# Patient Record
Sex: Male | Born: 1955 | Hispanic: Yes | Marital: Married | Smoking: Former smoker
Health system: Southern US, Community
[De-identification: ages and names within clinical notes are randomized; demographics above are authoritative.]

## PROBLEM LIST (undated history)

## (undated) DIAGNOSIS — I10 Essential (primary) hypertension: Secondary | ICD-10-CM

## (undated) DIAGNOSIS — M109 Gout, unspecified: Secondary | ICD-10-CM

## (undated) DIAGNOSIS — E119 Type 2 diabetes mellitus without complications: Secondary | ICD-10-CM

---

## 2009-01-24 ENCOUNTER — Emergency Department: Payer: Self-pay | Admitting: Emergency Medicine

## 2011-02-19 ENCOUNTER — Ambulatory Visit: Payer: Self-pay | Admitting: Internal Medicine

## 2011-03-08 ENCOUNTER — Ambulatory Visit: Payer: Self-pay | Admitting: Internal Medicine

## 2013-03-25 HISTORY — PX: JOINT REPLACEMENT: SHX530

## 2013-04-01 ENCOUNTER — Ambulatory Visit: Payer: Self-pay | Admitting: Orthopedic Surgery

## 2013-04-01 LAB — CBC
HCT: 41.1 % (ref 40.0–52.0)
MCHC: 33.8 g/dL (ref 32.0–36.0)
MCV: 85 fL (ref 80–100)
Platelet: 268 10*3/uL (ref 150–440)
RBC: 4.85 10*6/uL (ref 4.40–5.90)
RDW: 14 % (ref 11.5–14.5)

## 2013-04-01 LAB — BASIC METABOLIC PANEL
Anion Gap: 3 — ABNORMAL LOW (ref 7–16)
BUN: 21 mg/dL — ABNORMAL HIGH (ref 7–18)
Calcium, Total: 9.1 mg/dL (ref 8.5–10.1)
Co2: 27 mmol/L (ref 21–32)
EGFR (Non-African Amer.): >60
Potassium: 4.1 mmol/L (ref 3.5–5.1)
Sodium: 136 mmol/L (ref 136–145)

## 2013-04-01 LAB — URINALYSIS, COMPLETE
Bacteria: NONE SEEN
Bilirubin,UR: NEGATIVE
Blood: NEGATIVE
Glucose,UR: NEGATIVE mg/dL (ref 0–75)
Leukocyte Esterase: NEGATIVE
Nitrite: NEGATIVE
Ph: 5 (ref 4.5–8.0)
Protein: NEGATIVE
RBC,UR: 2 /HPF (ref 0–5)
Specific Gravity: 1.02 (ref 1.003–1.030)
Squamous Epithelial: 2

## 2013-04-01 LAB — PROTIME-INR
INR: 1
Prothrombin Time: 12.9 secs (ref 11.5–14.7)

## 2013-04-01 LAB — MRSA PCR SCREENING

## 2013-04-16 ENCOUNTER — Inpatient Hospital Stay: Payer: Self-pay | Admitting: Orthopedic Surgery

## 2013-04-17 LAB — BASIC METABOLIC PANEL
Anion Gap: 7 (ref 7–16)
BUN: 8 mg/dL (ref 7–18)
Calcium, Total: 8 mg/dL — ABNORMAL LOW (ref 8.5–10.1)
Chloride: 104 mmol/L (ref 98–107)
Co2: 24 mmol/L (ref 21–32)
Creatinine: 0.92 mg/dL (ref 0.60–1.30)
EGFR (African American): >60
EGFR (Non-African Amer.): >60
Osmolality: 270 (ref 275–301)
Potassium: 3.9 mmol/L (ref 3.5–5.1)

## 2013-04-20 LAB — PATHOLOGY REPORT

## 2014-10-15 NOTE — Discharge Summary (Signed)
PATIENT NAME:  Derek Townsend, Duell B MR#:  [id number] DATE OF BIRTH:  1955/07/23  DATE OF ADMISSION:  04/16/2013 DATE OF DISCHARGE:  04/19/2013  ADMITTING DIAGNOSIS: Avascular necrosis, left hip.   DISCHARGE DIAGNOSIS: Avascular necrosis, left hip.  OPERATION: On 04/16/2013, he had a left total hip arthroplasty.   SURGEON: Dr. Kennedy BuckerMichael Menz.   ANESTHESIA: Spinal.   ESTIMATED BLOOD LOSS: 500 mL.   DRAINS: Hemovac.   IMPLANTS: Medacta 4 lat Amis, 54 versa fit cup DM, with liner M28 head.   COMPLICATIONS: None.   HISTORY: Derek Townsend is a 59 year old Hispanic male, who has been having progressive pain over the past 3 years. He noticed a  leg length discrepancy where his left leg was shorter than his right. Pain with weight bearing and at rest. History of heavy alcohol use. He had an MRI, which confirmed  avascular necrosis of the hip.   PHYSICAL EXAMINATION:  LUNGS: Clear.  HEART: Regular rate and rhythm.  HEENT: Normal.  MUSCULOSKELETAL: Neurovascularly intact in left lower extremity. He keeps his legs in external rotation. Approximately half an inch shorter on the left when seated. He has -20 degrees internal rotation, external rotation 20 degrees to 40 degrees with severe pain.  SKIN: Intact.   HOSPITAL COURSE: After initial admission on 04/16/2013, he underwent a total left total hip arthroplasty. On postoperative day 1, 04/17/2013, his Hemovac output was 70 mL. Hemoglobin was 11.3. He was on room air. Physical therapy was begun on that day. On postoperative day 2, 04/18/2013, he progressed with physical therapy and ambulated 180 feet. Dressing was changed. Hemovac was discontinued. He had a bowel movement. On postoperative day 3, 04/19/2013, he ambulated 250 feet with stairs. He is stable and ready for discharge.   CONDITION AT DISCHARGE: Stable.   DISPOSITION: The patient was sent home with home health.   DISCHARGE INSTRUCTIONS: The patient will [facility] in 2 weeks for staple removal. He will have home health physical therapy and weight bear as tolerated on the left lower extremity. TED hose knee-high bilaterally. Regular diet. He may change his dressing once daily and as needed.   DISCHARGE MEDICATIONS: Please see discharge instructions for a complete list of discharge medications.  ____________________________ Leigh Blas M. Haskel KhanBerndt, NP amb:aw D: 04/20/2013 08:21:45 ET T: 04/20/2013 08:34:53 ET JOB#: [id number]  cc: Leavy Heatherly M. Haskel KhanBerndt, NP, <Dictator> Burt EkAPRIL M Xiamara Hulet FNP ELECTRONICALLY SIGNED 05/01/2013 9:50

## 2014-10-15 NOTE — Op Note (Signed)
PATIENT NAME:  Derek Townsend, Derek Townsend MR#:  [id number] DATE OF BIRTH:  Dec 21, 1955  DATE OF PROCEDURE:  04/16/2013  PREOPERATIVE DIAGNOSIS: Left hip avascular necrosis.   POSTOPERATIVE DIAGNOSIS: Left hip avascular necrosis.   PROCEDURE: Left anterior total hip.   SURGEON: Kennedy BuckerMichael Persephone Schriever, M.D.   ASSISTANT: Devota PaceApril Berndt, nurse practitioner.   ANESTHESIA: Spinal.   DESCRIPTION OF PROCEDURE: The patient was brought to the operating room, and after adequate anesthesia was obtained, the patient was placed on the operative table with the left leg in the Medacta attachment, the right leg on a well-padded table. The C-arm was brought in and good visualization of both hips could be obtained with printed pictures of both obtained. The hip was then prepped and draped using the standard fashion. C-arm was brought in to get landmarks and a direct anterior approach was made centered over the greater trochanter with incision down through the skin and subcutaneous tissue. The TFL was opened and the muscle retracted laterally. The deep fascia was then opened and the lateral femoral circumflex vessels were ligated. The anterior capsule was then opened and a femoral neck cut made. The head was removed without difficulty. It was quite deformed consistent with avascular necrosis. After removing the head, a Charnley retractor was placed. The femur was reamed up to 54 mm at which point there was good bleeding bone. After the trial was noted to fit well, the 54 mm Versafit cup DM was placed and impacted into position and felt stable. The leg was externally rotated and pubofemoral and ischiofemoral ligaments released. The leg was brought down into extension with adduction. Sequential broaching was carried out after first opening the canal with a box osteotome. A #4 stem had a tight fit and with trials it was determined that a lateral offset restored anatomy better than the standard offset. The #4 lateral offset stem was impacted down  the canal with an M 28 mm head and 54 mm Versafit cup DM liner, which were assembled on the back table. The hip was reduced and was stable to external rotation at 90 degrees and traction on the neck. The hip was thoroughly irrigated. 30 mL of 0.25% Sensorcaine with epinephrine was infiltrated into the soft tissues. Heavy quill was used to close the fascia, 2-0 quill subcutaneously with a subcutaneous Hemovac drain placed. Skin staples applied, Xeroform, 4 x 4's, ABD and tape. The patient was then sent to the recovery room in stable condition.   ESTIMATED BLOOD LOSS: 500.   COMPLICATIONS: None.   SPECIMEN: Removed femoral head.   IMPLANTS: Medacta Versafit cup DM 54 mm with associated liner, a 28 mm M head and a 4 lateralized AMIS stem. ____________________________ Leitha SchullerMichael J. Gemini Beaumier, MD mjm:sb D: 04/16/2013 09:25:28 ET T: 04/16/2013 09:32:20 ET JOB#: [id number]  cc: Leitha SchullerMichael J. Sayre Witherington, MD, <Dictator> Leitha SchullerMICHAEL J Anselmo Reihl MD ELECTRONICALLY SIGNED 04/16/2013 13:18

## 2015-10-20 ENCOUNTER — Encounter
Admission: RE | Admit: 2015-10-20 | Discharge: 2015-10-20 | Disposition: A | Discharge status: Home / Self Care | Payer: 59 | Source: Ambulatory Visit | Attending: Orthopedic Surgery | Admitting: Orthopedic Surgery

## 2015-10-20 DIAGNOSIS — Z01810 Encounter for preprocedural cardiovascular examination: Secondary | ICD-10-CM | POA: Diagnosis present

## 2015-10-20 DIAGNOSIS — Z01812 Encounter for preprocedural laboratory examination: Secondary | ICD-10-CM | POA: Insufficient documentation

## 2015-10-20 HISTORY — DX: Essential (primary) hypertension: I10

## 2015-10-20 HISTORY — DX: Type 2 diabetes mellitus without complications: E11.9

## 2015-10-20 HISTORY — DX: Gout, unspecified: M10.9

## 2015-10-20 LAB — CBC
HEMATOCRIT: 41.7 % (ref 40.0–52.0)
HEMOGLOBIN: 13.8 g/dL (ref 13.0–18.0)
MCH: 28.2 pg (ref 26.0–34.0)
MCHC: 33.1 g/dL (ref 32.0–36.0)
MCV: 85.2 fL (ref 80.0–100.0)
Platelets: 238 10*3/uL (ref 150–440)
RBC: 4.9 MIL/uL (ref 4.40–5.90)
RDW: 14.8 % — AB (ref 11.5–14.5)
WBC: 7.3 10*3/uL (ref 3.8–10.6)

## 2015-10-20 LAB — URINALYSIS COMPLETE WITH MICROSCOPIC (ARMC ONLY)
BACTERIA UA: NONE SEEN
Bilirubin Urine: NEGATIVE
Glucose, UA: NEGATIVE mg/dL
Hgb urine dipstick: NEGATIVE
KETONES UR: NEGATIVE mg/dL
Leukocytes, UA: NEGATIVE
Nitrite: NEGATIVE
PH: 6 (ref 5.0–8.0)
Protein, ur: NEGATIVE mg/dL
Specific Gravity, Urine: 1.012 (ref 1.005–1.030)

## 2015-10-20 LAB — TYPE AND SCREEN
ABO/RH(D): O POS
ANTIBODY SCREEN: NEGATIVE

## 2015-10-20 LAB — BASIC METABOLIC PANEL
Anion gap: 7 (ref 5–15)
BUN: 15 mg/dL (ref 6–20)
CHLORIDE: 106 mmol/L (ref 101–111)
CO2: 26 mmol/L (ref 22–32)
CREATININE: 0.8 mg/dL (ref 0.61–1.24)
Calcium: 9.4 mg/dL (ref 8.9–10.3)
GFR calc Af Amer: >60 mL/min (ref >60)
GFR calc non Af Amer: >60 mL/min (ref >60)
Glucose, Bld: 106 mg/dL — ABNORMAL HIGH (ref 65–99)
Potassium: 4.4 mmol/L (ref 3.5–5.1)
SODIUM: 139 mmol/L (ref 135–145)

## 2015-10-20 LAB — SURGICAL PCR SCREEN
MRSA, PCR: NEGATIVE
Staphylococcus aureus: NEGATIVE

## 2015-10-20 LAB — SEDIMENTATION RATE: Sed Rate: 11 mm/hr (ref 0–20)

## 2015-10-20 LAB — PROTIME-INR
INR: 0.99
Prothrombin Time: 13.3 seconds (ref 11.4–15.0)

## 2015-10-20 LAB — APTT: aPTT: 30 seconds (ref 24–36)

## 2015-10-20 LAB — ABO/RH: ABO/RH(D): O POS

## 2015-10-20 NOTE — Patient Instructions (Signed)
  Your procedure is scheduled on: Nov 03, 2015 (Thursday) Report to Day Surgery.([facility]) SECOND FLOOR To find out your arrival time please call [phone] between 1PM - 3PM on Nov 02, 2015 (Wednesday).  Remember: Instructions that are not followed completely may result in serious medical risk, up to and including death, or upon the discretion of your surgeon and anesthesiologist your surgery may need to be rescheduled.    __x__ 1. Do not eat food or drink liquids after midnight. No gum chewing or hard candies.     ____ 2. No Alcohol for 24 hours before or after surgery.   ____ 3. Bring all medications with you on the day of surgery if instructed.    __x__ 4. Notify your doctor if there is any change in your medical condition     (cold, fever, infections).     Do not wear jewelry, make-up, hairpins, clips or nail polish.  Do not wear lotions, powders, or perfumes. You may wear deodorant.  Do not shave 48 hours prior to surgery. Men may shave face and neck.  Do not bring valuables to the hospital.    [facility] is not responsible for any belongings or valuables.               Contacts, dentures or bridgework may not be worn into surgery.  Leave your suitcase in the car. After surgery it may be brought to your room.  For patients admitted to the hospital, discharge time is determined by your                treatment team.   Patients discharged the day of surgery will not be allowed to drive home.   Please read over the following fact sheets that you were given:   MRSA Information and Surgical Site Infection Prevention   _x___ Take these medicines the morning of surgery with A SIP OF WATER:    1. Amlodipine  2.   3.   4.  5.  6.  ____ Fleet Enema (as directed)   __x__ Use CHG Soap as directed  ____ Use inhalers on the day of surgery  ____ Stop metformin 2 days prior to surgery    ____ Take 1/2 of usual insulin dose the night before surgery and none on the morning  of surgery.   __x__ Stop Coumadin/Plavix/aspirin on (N/A)  __x__ Stop Anti-inflammatories on (NO NSAIDS)  STOP MELOXICAM ONE WEEK PRIOR TO SURGERY)  TYLENOL OK TO TAKE FOR PAIN IF NEEDED   ____ Stop supplements until after surgery.    ____ Bring C-Pap to the hospital.

## 2015-10-22 LAB — URINE CULTURE: CULTURE: NO GROWTH

## 2015-11-03 ENCOUNTER — Inpatient Hospital Stay: Payer: 59 | Admitting: Anesthesiology

## 2015-11-03 ENCOUNTER — Inpatient Hospital Stay: Payer: 59

## 2015-11-03 ENCOUNTER — Encounter
Admission: RE | Disposition: A | Discharge status: Home / Self Care | Payer: Self-pay | Source: Ambulatory Visit | Attending: Orthopedic Surgery

## 2015-11-03 ENCOUNTER — Inpatient Hospital Stay
Admission: RE | Admit: 2015-11-03 | Discharge: 2015-11-06 | DRG: 470 | Disposition: A | Discharge status: Home Health | Payer: 59 | Source: Ambulatory Visit | Attending: Orthopedic Surgery | Admitting: Orthopedic Surgery

## 2015-11-03 DIAGNOSIS — Z87891 Personal history of nicotine dependence: Secondary | ICD-10-CM | POA: Diagnosis not present

## 2015-11-03 DIAGNOSIS — M1611 Unilateral primary osteoarthritis, right hip: Secondary | ICD-10-CM | POA: Diagnosis present

## 2015-11-03 DIAGNOSIS — E119 Type 2 diabetes mellitus without complications: Secondary | ICD-10-CM | POA: Diagnosis present

## 2015-11-03 DIAGNOSIS — Z96642 Presence of left artificial hip joint: Secondary | ICD-10-CM | POA: Diagnosis present

## 2015-11-03 DIAGNOSIS — Z419 Encounter for procedure for purposes other than remedying health state, unspecified: Secondary | ICD-10-CM

## 2015-11-03 DIAGNOSIS — I10 Essential (primary) hypertension: Secondary | ICD-10-CM | POA: Diagnosis present

## 2015-11-03 DIAGNOSIS — M109 Gout, unspecified: Secondary | ICD-10-CM | POA: Diagnosis present

## 2015-11-03 DIAGNOSIS — M879 Osteonecrosis, unspecified: Secondary | ICD-10-CM | POA: Diagnosis present

## 2015-11-03 DIAGNOSIS — Z79899 Other long term (current) drug therapy: Secondary | ICD-10-CM

## 2015-11-03 DIAGNOSIS — Z791 Long term (current) use of non-steroidal anti-inflammatories (NSAID): Secondary | ICD-10-CM | POA: Diagnosis not present

## 2015-11-03 DIAGNOSIS — G8918 Other acute postprocedural pain: Secondary | ICD-10-CM

## 2015-11-03 HISTORY — PX: TOTAL HIP ARTHROPLASTY: SHX124

## 2015-11-03 LAB — CBC
HEMATOCRIT: 37.8 % — AB (ref 40.0–52.0)
Hemoglobin: 12.1 g/dL — ABNORMAL LOW (ref 13.0–18.0)
MCH: 27.7 pg (ref 26.0–34.0)
MCHC: 32 g/dL (ref 32.0–36.0)
MCV: 86.6 fL (ref 80.0–100.0)
Platelets: 220 10*3/uL (ref 150–440)
RBC: 4.37 MIL/uL — ABNORMAL LOW (ref 4.40–5.90)
RDW: 14.8 % — AB (ref 11.5–14.5)
WBC: 17.1 10*3/uL — AB (ref 3.8–10.6)

## 2015-11-03 LAB — CREATININE, SERUM
Creatinine, Ser: 0.75 mg/dL (ref 0.61–1.24)
GFR calc Af Amer: >60 mL/min (ref >60)

## 2015-11-03 LAB — GLUCOSE, CAPILLARY
GLUCOSE-CAPILLARY: 113 mg/dL — AB (ref 65–99)
GLUCOSE-CAPILLARY: 131 mg/dL — AB (ref 65–99)
Glucose-Capillary: 122 mg/dL — ABNORMAL HIGH (ref 65–99)
Glucose-Capillary: 126 mg/dL — ABNORMAL HIGH (ref 65–99)

## 2015-11-03 SURGERY — ARTHROPLASTY, HIP, TOTAL, ANTERIOR APPROACH
Anesthesia: Spinal | Site: Hip | Laterality: Right | Wound class: Clean

## 2015-11-03 MED ORDER — ENOXAPARIN SODIUM 40 MG/0.4ML ~~LOC~~ SOLN
40.0000 mg | SUBCUTANEOUS | Status: DC
  Administered 2015-11-04 – 2015-11-06 (×3): 40 mg via SUBCUTANEOUS
  Filled 2015-11-03 (×3): qty 0.4

## 2015-11-03 MED ORDER — SODIUM CHLORIDE 0.9 % IV SOLN
INTRAVENOUS | Status: DC
  Administered 2015-11-03 – 2015-11-04 (×3): via INTRAVENOUS

## 2015-11-03 MED ORDER — BUPIVACAINE-EPINEPHRINE 0.25% -1:200000 IJ SOLN
INTRAMUSCULAR | Status: DC | PRN
  Administered 2015-11-03: 30 mL

## 2015-11-03 MED ORDER — ACETAMINOPHEN 325 MG PO TABS
650.0000 mg | ORAL_TABLET | Freq: Four times a day (QID) | ORAL | Status: DC | PRN
  Administered 2015-11-06: 650 mg via ORAL
  Filled 2015-11-03: qty 2

## 2015-11-03 MED ORDER — ONDANSETRON HCL 4 MG/2ML IJ SOLN
4.0000 mg | Freq: Four times a day (QID) | INTRAMUSCULAR | Status: DC | PRN
  Administered 2015-11-03 – 2015-11-04 (×2): 4 mg via INTRAVENOUS
  Filled 2015-11-03 (×2): qty 2

## 2015-11-03 MED ORDER — CEFAZOLIN SODIUM-DEXTROSE 2-4 GM/100ML-% IV SOLN
INTRAVENOUS | Status: AC
  Filled 2015-11-03: qty 100

## 2015-11-03 MED ORDER — TRANEXAMIC ACID 1000 MG/10ML IV SOLN
1000.0000 mg | INTRAVENOUS | Status: DC | PRN
  Administered 2015-11-03: 1000 mg via INTRAVENOUS

## 2015-11-03 MED ORDER — ZOLPIDEM TARTRATE 5 MG PO TABS: 5.0000 mg | ORAL_TABLET | Freq: Every evening | ORAL | Status: DC | PRN

## 2015-11-03 MED ORDER — OXYCODONE HCL 5 MG PO TABS
5.0000 mg | ORAL_TABLET | ORAL | Status: DC | PRN
  Administered 2015-11-03: 10 mg via ORAL
  Administered 2015-11-03: 5 mg via ORAL
  Administered 2015-11-03: 10 mg via ORAL
  Administered 2015-11-03: 5 mg via ORAL
  Administered 2015-11-04: 10 mg via ORAL
  Administered 2015-11-04: 5 mg via ORAL
  Administered 2015-11-04 – 2015-11-06 (×11): 10 mg via ORAL
  Filled 2015-11-03 (×4): qty 2
  Filled 2015-11-03: qty 1
  Filled 2015-11-03 (×5): qty 2
  Filled 2015-11-03: qty 1
  Filled 2015-11-03 (×6): qty 2

## 2015-11-03 MED ORDER — PRAVASTATIN SODIUM 20 MG PO TABS
40.0000 mg | ORAL_TABLET | Freq: Every day | ORAL | Status: DC
  Administered 2015-11-03 – 2015-11-05 (×3): 40 mg via ORAL
  Filled 2015-11-03 (×3): qty 2

## 2015-11-03 MED ORDER — ONDANSETRON HCL 4 MG PO TABS: 4.0000 mg | ORAL_TABLET | Freq: Four times a day (QID) | ORAL | Status: DC | PRN

## 2015-11-03 MED ORDER — TRANEXAMIC ACID 1000 MG/10ML IV SOLN
INTRAVENOUS | Status: AC
  Filled 2015-11-03: qty 10

## 2015-11-03 MED ORDER — BISACODYL 5 MG PO TBEC
5.0000 mg | DELAYED_RELEASE_TABLET | Freq: Every day | ORAL | Status: DC | PRN
  Administered 2015-11-05: 5 mg via ORAL
  Filled 2015-11-03 (×2): qty 1

## 2015-11-03 MED ORDER — DIPHENHYDRAMINE HCL 12.5 MG/5ML PO ELIX
12.5000 mg | ORAL_SOLUTION | ORAL | Status: DC | PRN
  Filled 2015-11-03: qty 10

## 2015-11-03 MED ORDER — PHENOL 1.4 % MT LIQD
1.0000 | OROMUCOSAL | Status: DC | PRN
  Filled 2015-11-03: qty 177

## 2015-11-03 MED ORDER — ONDANSETRON HCL 4 MG/2ML IJ SOLN: 4.0000 mg | Freq: Once | INTRAMUSCULAR | Status: DC | PRN

## 2015-11-03 MED ORDER — NEOMYCIN-POLYMYXIN B GU 40-200000 IR SOLN
Status: AC
  Filled 2015-11-03: qty 2

## 2015-11-03 MED ORDER — METHOCARBAMOL 500 MG PO TABS: 500.0000 mg | ORAL_TABLET | Freq: Four times a day (QID) | ORAL | Status: DC | PRN

## 2015-11-03 MED ORDER — NEOMYCIN-POLYMYXIN B GU 40-200000 IR SOLN
Status: DC | PRN
  Administered 2015-11-03: 2 mL

## 2015-11-03 MED ORDER — BUPIVACAINE-EPINEPHRINE (PF) 0.25% -1:200000 IJ SOLN
INTRAMUSCULAR | Status: AC
  Filled 2015-11-03: qty 30

## 2015-11-03 MED ORDER — MORPHINE SULFATE (PF) 2 MG/ML IV SOLN
2.0000 mg | INTRAVENOUS | Status: DC | PRN
  Administered 2015-11-03 (×4): 2 mg via INTRAVENOUS
  Filled 2015-11-03 (×4): qty 1

## 2015-11-03 MED ORDER — ACETAMINOPHEN 650 MG RE SUPP: 650.0000 mg | Freq: Four times a day (QID) | RECTAL | Status: DC | PRN

## 2015-11-03 MED ORDER — BUPIVACAINE HCL (PF) 0.5 % IJ SOLN
INTRAMUSCULAR | Status: DC | PRN
  Administered 2015-11-03: 3 mL

## 2015-11-03 MED ORDER — ALLOPURINOL 100 MG PO TABS
200.0000 mg | ORAL_TABLET | Freq: Every day | ORAL | Status: DC
  Administered 2015-11-03 – 2015-11-06 (×4): 200 mg via ORAL
  Filled 2015-11-03 (×4): qty 2

## 2015-11-03 MED ORDER — FENTANYL CITRATE (PF) 100 MCG/2ML IJ SOLN: 25.0000 ug | INTRAMUSCULAR | Status: DC | PRN

## 2015-11-03 MED ORDER — AMLODIPINE BESYLATE 10 MG PO TABS
10.0000 mg | ORAL_TABLET | Freq: Every day | ORAL | Status: DC
  Administered 2015-11-04 – 2015-11-06 (×3): 10 mg via ORAL
  Filled 2015-11-03 (×3): qty 1

## 2015-11-03 MED ORDER — PHENYLEPHRINE HCL 10 MG/ML IJ SOLN
INTRAMUSCULAR | Status: DC | PRN
  Administered 2015-11-03: 100 ug via INTRAVENOUS

## 2015-11-03 MED ORDER — METOCLOPRAMIDE HCL 10 MG PO TABS: 5.0000 mg | ORAL_TABLET | Freq: Three times a day (TID) | ORAL | Status: DC | PRN

## 2015-11-03 MED ORDER — PROPOFOL 500 MG/50ML IV EMUL
INTRAVENOUS | Status: DC | PRN
  Administered 2015-11-03: 75 ug/kg/min via INTRAVENOUS

## 2015-11-03 MED ORDER — HYDROMORPHONE HCL 1 MG/ML IJ SOLN
0.5000 mg | Freq: Once | INTRAMUSCULAR | Status: AC
  Administered 2015-11-03: 0.5 mg via INTRAVENOUS
  Filled 2015-11-03: qty 1

## 2015-11-03 MED ORDER — MAGNESIUM CITRATE PO SOLN
1.0000 | Freq: Once | ORAL | Status: AC | PRN
  Administered 2015-11-05: 1 via ORAL
  Filled 2015-11-03 (×2): qty 296

## 2015-11-03 MED ORDER — DEXTROSE 5 % IV SOLN
500.0000 mg | Freq: Four times a day (QID) | INTRAVENOUS | Status: DC | PRN
  Filled 2015-11-03: qty 5

## 2015-11-03 MED ORDER — CEFAZOLIN SODIUM-DEXTROSE 2-4 GM/100ML-% IV SOLN
2.0000 g | Freq: Four times a day (QID) | INTRAVENOUS | Status: AC
  Administered 2015-11-03 (×3): 2 g via INTRAVENOUS
  Filled 2015-11-03 (×3): qty 100

## 2015-11-03 MED ORDER — FAMOTIDINE 20 MG PO TABS
ORAL_TABLET | ORAL | Status: AC
  Administered 2015-11-03: 20 mg via ORAL
  Filled 2015-11-03: qty 1

## 2015-11-03 MED ORDER — METOCLOPRAMIDE HCL 5 MG/ML IJ SOLN: 5.0000 mg | Freq: Three times a day (TID) | INTRAMUSCULAR | Status: DC | PRN

## 2015-11-03 MED ORDER — MENTHOL 3 MG MT LOZG
1.0000 | LOZENGE | OROMUCOSAL | Status: DC | PRN
  Filled 2015-11-03: qty 9

## 2015-11-03 MED ORDER — FAMOTIDINE 20 MG PO TABS
20.0000 mg | ORAL_TABLET | Freq: Once | ORAL | Status: AC
  Administered 2015-11-03: 20 mg via ORAL

## 2015-11-03 MED ORDER — MIDAZOLAM HCL 5 MG/5ML IJ SOLN
INTRAMUSCULAR | Status: DC | PRN
  Administered 2015-11-03: 2 mg via INTRAVENOUS

## 2015-11-03 MED ORDER — SODIUM CHLORIDE 0.9 % IV SOLN
INTRAVENOUS | Status: DC
  Administered 2015-11-03 (×4): via INTRAVENOUS

## 2015-11-03 MED ORDER — PROPOFOL 10 MG/ML IV BOLUS
INTRAVENOUS | Status: DC | PRN
  Administered 2015-11-03: 30 mg via INTRAVENOUS

## 2015-11-03 MED ORDER — CEFAZOLIN SODIUM-DEXTROSE 2-4 GM/100ML-% IV SOLN
2.0000 g | Freq: Once | INTRAVENOUS | Status: AC
  Administered 2015-11-03: 2 g via INTRAVENOUS

## 2015-11-03 MED ORDER — DOCUSATE SODIUM 100 MG PO CAPS
100.0000 mg | ORAL_CAPSULE | Freq: Two times a day (BID) | ORAL | Status: DC
  Administered 2015-11-03 – 2015-11-06 (×7): 100 mg via ORAL
  Filled 2015-11-03 (×6): qty 1

## 2015-11-03 MED ORDER — MAGNESIUM HYDROXIDE 400 MG/5ML PO SUSP
30.0000 mL | Freq: Every day | ORAL | Status: DC | PRN
  Administered 2015-11-04 – 2015-11-05 (×3): 30 mL via ORAL
  Filled 2015-11-03 (×3): qty 30

## 2015-11-03 MED ORDER — ONDANSETRON HCL 4 MG/2ML IJ SOLN
INTRAMUSCULAR | Status: DC | PRN
  Administered 2015-11-03: 4 mg via INTRAVENOUS

## 2015-11-03 MED ORDER — KETAMINE HCL 50 MG/ML IJ SOLN
INTRAMUSCULAR | Status: DC | PRN
  Administered 2015-11-03: 50 mg via INTRAMUSCULAR

## 2015-11-03 SURGICAL SUPPLY — 44 items
BLADE SAW SAG 18.5X105 (BLADE) ×3 IMPLANT
BNDG COHESIVE 6X5 TAN STRL LF (GAUZE/BANDAGES/DRESSINGS) ×6 IMPLANT
CANISTER SUCT 1200ML W/VALVE (MISCELLANEOUS) ×3 IMPLANT
CAPT HIP TOTAL 3 ×3 IMPLANT
CATH FOL LEG HOLDER (MISCELLANEOUS) ×3 IMPLANT
CATH TRAY METER 16FR LF (MISCELLANEOUS) ×3 IMPLANT
CHLORAPREP W/TINT 26ML (MISCELLANEOUS) ×3 IMPLANT
DRAPE C-ARM XRAY 36X54 (DRAPES) ×3 IMPLANT
DRAPE INCISE IOBAN 66X60 STRL (DRAPES) IMPLANT
DRAPE POUCH INSTRU U-SHP 10X18 (DRAPES) ×3 IMPLANT
DRAPE SHEET LG 3/4 BI-LAMINATE (DRAPES) ×9 IMPLANT
DRAPE STERI IOBAN 125X83 (DRAPES) ×3 IMPLANT
DRAPE TABLE BACK 80X90 (DRAPES) ×3 IMPLANT
DRSG OPSITE POSTOP 4X8 (GAUZE/BANDAGES/DRESSINGS) ×6 IMPLANT
ELECT BLADE 6.5 EXT (BLADE) ×3 IMPLANT
GAUZE SPONGE 4X4 12PLY STRL (GAUZE/BANDAGES/DRESSINGS) ×3 IMPLANT
GLOVE BIOGEL PI IND STRL 9 (GLOVE) ×1 IMPLANT
GLOVE BIOGEL PI INDICATOR 9 (GLOVE) ×2
GLOVE SURG ORTHO 9.0 STRL STRW (GLOVE) ×6 IMPLANT
GOWN STRL REUS W/ TWL LRG LVL3 (GOWN DISPOSABLE) ×1 IMPLANT
GOWN STRL REUS W/TWL LRG LVL3 (GOWN DISPOSABLE) ×2
GOWN SURG XXL (GOWNS) ×3 IMPLANT
HEMOVAC 400CC 10FR (MISCELLANEOUS) ×3 IMPLANT
HOOD PEEL AWAY FLYTE STAYCOOL (MISCELLANEOUS) ×3 IMPLANT
MAT BLUE FLOOR 46X72 FLO (MISCELLANEOUS) ×3 IMPLANT
NDL SAFETY 18GX1.5 (NEEDLE) ×3 IMPLANT
NEEDLE SPNL 18GX3.5 QUINCKE PK (NEEDLE) ×3 IMPLANT
NS IRRIG 1000ML POUR BTL (IV SOLUTION) ×3 IMPLANT
PACK HIP COMPR (MISCELLANEOUS) ×3 IMPLANT
SOL PREP PVP 2OZ (MISCELLANEOUS) ×3
SOLUTION PREP PVP 2OZ (MISCELLANEOUS) ×1 IMPLANT
STAPLER SKIN PROX 35W (STAPLE) ×3 IMPLANT
STRAP SAFETY BODY (MISCELLANEOUS) ×3 IMPLANT
SUT DVC 2 QUILL PDO  T11 36X36 (SUTURE) ×2
SUT DVC 2 QUILL PDO T11 36X36 (SUTURE) ×1 IMPLANT
SUT DVC QUILL MONODERM 30X30 (SUTURE) ×3 IMPLANT
SUT SILK 0 (SUTURE) ×2
SUT SILK 0 30XBRD TIE 6 (SUTURE) ×1 IMPLANT
SUT VIC AB 1 CT1 36 (SUTURE) ×3 IMPLANT
SYR 20CC LL (SYRINGE) ×3 IMPLANT
SYR 30ML LL (SYRINGE) ×3 IMPLANT
TAPE MICROFOAM 4IN (TAPE) ×3 IMPLANT
TOWEL OR 17X26 4PK STRL BLUE (TOWEL DISPOSABLE) ×3 IMPLANT
TUBE KAMVAC SUCTION (TUBING) ×3 IMPLANT

## 2015-11-03 NOTE — Op Note (Signed)
11/03/2015  9:04 AM  PATIENT:  Derek Townsend  60 y.o. male  PRE-OPERATIVE DIAGNOSIS:  avascular necrosis right hip  POST-OPERATIVE DIAGNOSIS:  avascular necrosis  PROCEDURE:  Procedure(s): TOTAL HIP ARTHROPLASTY ANTERIOR APPROACH (Right)  SURGEON: Leitha SchullerMichael J Song Myre, MD  ASSISTANTS: None  ANESTHESIA:   spinal  EBL:  Total I/O In: 1400 [I.V.:1400] Out: 800 [Urine:400; Blood:400]  BLOOD ADMINISTERED:none  DRAINS: none   LOCAL MEDICATIONS USED:  MARCAINE     SPECIMEN:  Source of Specimen:  Right femoral head  DISPOSITION OF SPECIMEN:  PATHOLOGY  COUNTS:  YES  TOURNIQUET:  * No tourniquets in log *  IMPLANTS: Medacta AMIS 4 standard stem with 56 mm Mpact cup DM and liner with S 28 mm head  DICTATION: .Dragon Dictation   The patient was brought to the operating room and after spinal anesthesia was obtained patient was placed on the operative table with the ipsilateral foot into the Medacta attachment, contralateral leg on a well-padded table. C-arm was brought in and preop template x-ray taken. After prepping and draping in usual sterile fashion appropriate patient identification and timeout procedures were completed. Anterior approach to the hip was obtained and centered over the greater trochanter and TFL muscle. The subcutaneous tissue was incised hemostasis being achieved by electrocautery. TFL fascia was incised and the muscle retracted laterally deep retractor placed. The lateral femoral circumflex vessels were identified and ligated. The anterior capsule was exposed and a capsulotomy performed. The neck was identified and a femoral neck cut carried out with a saw. The head was removed without difficulty and showed sclerotic femoral head and acetabulum. Reaming was carried out to 54 mm and a 56 mm cup trial gave appropriate tightness to the acetabular component a 56 Mpact cup DM cup was impacted into position. The leg was then externally rotated and ischiofemoral and  pubolofemoral releases carried out. The femur was sequentially broached to a size 4, size 4 stem and S head with standard neck trials were placed and the final components chosen. The 4 standard stem was inserted along with a S 28 mm head and 86 mm liner. The hip was reduced and was stable the wound was thoroughly irrigated with a dilute Betadine solution. The deep fascia closed using a heavy Quill after infiltration of 30 cc of quarter percent Sensorcaine with epinephrine. 2-0Quill to close the skin with skin staples Xeroform and honeycomb dressing applied  PLAN OF CARE: Admit to inpatient   ```

## 2015-11-03 NOTE — OR Nursing (Signed)
Sacral pad sent to OR 

## 2015-11-03 NOTE — H&P (Signed)
Reviewed paper H+P, will be scanned into chart. No changes noted.  

## 2015-11-03 NOTE — Anesthesia Procedure Notes (Signed)
Spinal Patient location during procedure: OR Start time: 11/03/2015 7:15 AM End time: 11/03/2015 7:30 AM Staffing Resident/CRNA: Shallyn Constancio Performed by: resident/CRNA  Preanesthetic Checklist Completed: patient identified, site marked, surgical consent, pre-op evaluation, timeout performed, IV checked, risks and benefits discussed and monitors and equipment checked Spinal Block Patient position: sitting Prep: Betadine Patient monitoring: heart rate, continuous pulse ox, blood pressure and cardiac monitor Approach: midline Location: L3-4 Injection technique: single-shot Needle Needle type: Whitacre and Introducer  Needle gauge: 24 G Needle length: 9 cm Additional Notes Negative paresthesia. Negative blood return. Positive free-flowing CSF. Expiration date of kit checked and confirmed. Patient tolerated procedure well, without complications.

## 2015-11-03 NOTE — Anesthesia Preprocedure Evaluation (Signed)
Anesthesia Evaluation  Patient identified by MRN, date of birth, ID band Patient awake    Reviewed: Allergy & Precautions, NPO status , Patient's Chart, lab work & pertinent test results  Airway Mallampati: II       Dental  (+) Missing   Pulmonary neg pulmonary ROS, former smoker,    breath sounds clear to auscultation       Cardiovascular Exercise Tolerance: Good hypertension, Pt. on medications  Rhythm:Regular     Neuro/Psych    GI/Hepatic negative GI ROS, Neg liver ROS,   Endo/Other  diabetes, Type 2  Renal/GU      Musculoskeletal   Abdominal Normal abdominal exam  (+)   Peds  Hematology   Anesthesia Other Findings   Reproductive/Obstetrics                             Anesthesia Physical Anesthesia Plan  ASA: II  Anesthesia Plan: Spinal   Post-op Pain Management:    Induction:   Airway Management Planned: Natural Airway and Nasal Cannula  Additional Equipment:   Intra-op Plan:   Post-operative Plan:   Informed Consent: I have reviewed the patients History and Physical, chart, labs and discussed the procedure including the risks, benefits and alternatives for the proposed anesthesia with the patient or authorized representative who has indicated his/her understanding and acceptance.     Plan Discussed with: CRNA  Anesthesia Plan Comments:         Anesthesia Quick Evaluation

## 2015-11-03 NOTE — NC FL2 (Signed)
[place] MEDICAID FL2 LEVEL OF CARE SCREENING TOOL     IDENTIFICATION  Patient Name: Derek Townsend Birthdate: 04/17/1956 Sex: male Admission Date (Current Location): 11/03/2015  [place]   Facility and Address:  [facility], [address], [place], [place] [address]      Provider Number: [id number]  Attending Physician Name and Address:  Kennedy BuckerMichael Menz, MD  Relative Name and Phone Number:       Current Level of Care: Hospital Recommended Level of Care: Skilled Nursing Facility Prior Approval Number:    Date Approved/Denied:   PASRR Number:  [id number])  Discharge Plan: SNF    Current Diagnoses: Patient Active Problem List   Diagnosis Date Noted  . Osteonecrosis of right hip (HCC) 11/03/2015   Gout    High blood pressure    High cholesterol    Avascular necrosis of bone of left hip (CMS-HCC)  S/P left total hip replacement on 04/16/13  History of heavy alcohol consumption       Orientation RESPIRATION BLADDER Height & Weight     Self, Time, Situation, Place  Normal Continent Weight: 183 lb (83.008 kg) Height:     BEHAVIORAL SYMPTOMS/MOOD NEUROLOGICAL BOWEL NUTRITION STATUS   (none )  (none ) Continent Diet (Diet: Clear Liquid )  AMBULATORY STATUS COMMUNICATION OF NEEDS Skin   Extensive Assist Verbally Surgical wounds (Incision: Right Hip. )                       Personal Care Assistance Level of Assistance  Bathing, Feeding, Dressing Bathing Assistance: Limited assistance Feeding assistance: Independent Dressing Assistance: Limited assistance     Functional Limitations Info  Sight, Hearing, Speech Sight Info: Adequate Hearing Info: Adequate Speech Info: Adequate    SPECIAL CARE FACTORS FREQUENCY  PT (By licensed PT), OT (By licensed OT)     PT Frequency:  (5) OT Frequency:  (5)            Contractures      Additional Factors Info  Code Status, Allergies Code Status  Info:  (Not on File ) Allergies Info:  (No Known Allergies )           Current Medications (11/03/2015):  This is the current hospital active medication list Current Facility-Administered Medications  Medication Dose Route Frequency Provider Last Rate Last Dose  . 0.9 %  sodium chloride infusion   Intravenous Continuous Kennedy BuckerMichael Menz, MD 75 mL/hr at 11/03/15 1247    . acetaminophen (TYLENOL) tablet 650 mg  650 mg Oral Q6H PRN Kennedy BuckerMichael Menz, MD       Or  . acetaminophen (TYLENOL) suppository 650 mg  650 mg Rectal Q6H PRN Kennedy BuckerMichael Menz, MD      . allopurinol (ZYLOPRIM) tablet 200 mg  200 mg Oral Daily Kennedy BuckerMichael Menz, MD   200 mg at 11/03/15 1242  . [START ON 11/04/2015] amLODipine (NORVASC) tablet 10 mg  10 mg Oral Daily Kennedy BuckerMichael Menz, MD      . bisacodyl (DULCOLAX) EC tablet 5 mg  5 mg Oral Daily PRN Kennedy BuckerMichael Menz, MD      . ceFAZolin (ANCEF) 2-4 GM/100ML-% IVPB           . ceFAZolin (ANCEF) IVPB 2g/100 mL premix  2 g Intravenous Q6H Kennedy BuckerMichael Menz, MD   2 g at 11/03/15 1243  . diphenhydrAMINE (BENADRYL) 12.5 MG/5ML elixir 12.5-25 mg  12.5-25 mg Oral Q4H PRN Kennedy BuckerMichael Menz, MD      .  docusate sodium (COLACE) capsule 100 mg  100 mg Oral BID Kennedy Bucker, MD   100 mg at 11/03/15 1242  . [START ON 11/04/2015] enoxaparin (LOVENOX) injection 40 mg  40 mg Subcutaneous Q24H Kennedy Bucker, MD      . magnesium citrate solution 1 Bottle  1 Bottle Oral Once PRN Kennedy Bucker, MD      . magnesium hydroxide (MILK OF MAGNESIA) suspension 30 mL  30 mL Oral Daily PRN Kennedy Bucker, MD      . menthol-cetylpyridinium (CEPACOL) lozenge 3 mg  1 lozenge Oral PRN Kennedy Bucker, MD       Or  . phenol (CHLORASEPTIC) mouth spray 1 spray  1 spray Mouth/Throat PRN Kennedy Bucker, MD      . methocarbamol (ROBAXIN) tablet 500 mg  500 mg Oral Q6H PRN Kennedy Bucker, MD       Or  . methocarbamol (ROBAXIN) 500 mg in dextrose 5 % 50 mL IVPB  500 mg Intravenous Q6H PRN Kennedy Bucker, MD      . metoCLOPramide (REGLAN) tablet 5-10 mg  5-10 mg  Oral Q8H PRN Kennedy Bucker, MD       Or  . metoCLOPramide (REGLAN) injection 5-10 mg  5-10 mg Intravenous Q8H PRN Kennedy Bucker, MD      . morphine 2 MG/ML injection 2 mg  2 mg Intravenous Q1H PRN Kennedy Bucker, MD   2 mg at 11/03/15 1324  . ondansetron (ZOFRAN) tablet 4 mg  4 mg Oral Q6H PRN Kennedy Bucker, MD       Or  . ondansetron [facility]) injection 4 mg  4 mg Intravenous Q6H PRN Kennedy Bucker, MD      . oxyCODONE (Oxy IR/ROXICODONE) immediate release tablet 5-10 mg  5-10 mg Oral Q3H PRN Kennedy Bucker, MD   5 mg at 11/03/15 1242  . pravastatin (PRAVACHOL) tablet 40 mg  40 mg Oral QHS Kennedy Bucker, MD      . zolpidem [facility]) tablet 5 mg  5 mg Oral QHS PRN,MR X 1 Kennedy Bucker, MD         Discharge Medications: Please see discharge summary for a list of discharge medications.  Relevant Imaging Results:  Relevant Lab Results:   Additional Information  (SSN: [id number])  Haig Prophet, LCSW

## 2015-11-03 NOTE — Transfer of Care (Signed)
Immediate Anesthesia Transfer of Care Note  Patient: Derek Townsend  Procedure(s) Performed: Procedure(s): TOTAL HIP ARTHROPLASTY ANTERIOR APPROACH (Right)  Patient Location: PACU  Anesthesia Type:Spinal  Level of Consciousness: sedated  Airway & Oxygen Therapy: Patient Spontanous Breathing and Patient connected to face mask oxygen  Post-op Assessment: Report given to RN and Post -op Vital signs reviewed and stable  Post vital signs: Reviewed and stable  Last Vitals:  Filed Vitals:   11/03/15 0603  BP: 144/84  Pulse: 78  Temp: 37.2 C  Resp: 16    Last Pain: There were no vitals filed for this visit.       Complications: No apparent anesthesia complications

## 2015-11-03 NOTE — Progress Notes (Signed)
PT Attempt Note  Patient Details Name: Derek LamasFrancisco B Fairclough MRN: [id number] DOB: 12/03/1955   Cancelled Treatment:    Reason Eval/Treat Not Completed: Pain limiting ability to participate. Chart reviewed and RN consulted. Attempted to perform PT evaluation twice, first time at 1400 and second time at 1500. Pt refusing during both attempts due to pain. RN confirms that pt has been adequately medicated with morphine. Agrees to PT evaluation tomorrow AM. Will attempt PT evaluation tomorrow AM as appropriate.  Sharalyn InkJason D Chanetta Moosman PT, DPT   Bryar Dahms 11/03/2015, 3:38 PM

## 2015-11-03 NOTE — Anesthesia Postprocedure Evaluation (Signed)
Anesthesia Post Note  Patient: Jarome LamasFrancisco B Lasker  Procedure(s) Performed: Procedure(s) (LRB): TOTAL HIP ARTHROPLASTY ANTERIOR APPROACH (Right)  Patient location during evaluation: PACU Anesthesia Type: General Level of consciousness: awake Pain management: satisfactory to patient Vital Signs Assessment: post-procedure vital signs reviewed and stable Respiratory status: nonlabored ventilation Cardiovascular status: stable Anesthetic complications: no    Last Vitals:  Filed Vitals:   11/03/15 0603 11/03/15 0902  BP: 144/84 96/54  Pulse: 78 73  Temp: 37.2 C 35.9 C  Resp: 16 13    Last Pain: There were no vitals filed for this visit.               VAN STAVEREN,Myrtle Haller

## 2015-11-04 ENCOUNTER — Encounter: Payer: Self-pay | Admitting: Orthopedic Surgery

## 2015-11-04 LAB — GLUCOSE, CAPILLARY
GLUCOSE-CAPILLARY: 131 mg/dL — AB (ref 65–99)
Glucose-Capillary: 132 mg/dL — ABNORMAL HIGH (ref 65–99)

## 2015-11-04 LAB — BASIC METABOLIC PANEL
Anion gap: 6 (ref 5–15)
BUN: 10 mg/dL (ref 6–20)
CALCIUM: 8.2 mg/dL — AB (ref 8.9–10.3)
CO2: 25 mmol/L (ref 22–32)
CREATININE: 0.8 mg/dL (ref 0.61–1.24)
Chloride: 104 mmol/L (ref 101–111)
GFR calc Af Amer: >60 mL/min (ref >60)
GFR calc non Af Amer: >60 mL/min (ref >60)
GLUCOSE: 120 mg/dL — AB (ref 65–99)
Potassium: 3.8 mmol/L (ref 3.5–5.1)
Sodium: 135 mmol/L (ref 135–145)

## 2015-11-04 LAB — CREATININE, SERUM: CREATININE: 0.73 mg/dL (ref 0.61–1.24)

## 2015-11-04 NOTE — Progress Notes (Signed)
Clinical Social Worker (CSW) received SNF consult. PT is recommending home health. RN Case Manager is aware of above. Please reconsult if future social work needs arise. CSW signing off.   Dionna Wiedemann Morgan, LCSW [phone] 

## 2015-11-04 NOTE — Progress Notes (Signed)
   Subjective: 1 Day Post-Op Procedure(s) (LRB): TOTAL HIP ARTHROPLASTY ANTERIOR APPROACH (Right) Patient reports pain as 5 on 0-10 scale and 7 on 0-10 scale.   Patient is well, and has had no acute complaints or problems Denies any CP, SOB, ABD pain. We will continue therapy today.  Plan is to go Home after hospital stay.  Objective: Vital signs in last 24 hours: Temp:  [96.6 F (35.9 C)-98.7 F (37.1 C)] 98.2 F (36.8 C) (05/12 0402) Pulse Rate:  [59-82] 82 (05/12 0402) Resp:  [13-18] 18 (05/12 0402) BP: (96-131)/(54-78) 129/75 mmHg (05/12 0402) SpO2:  [95 %-100 %] 95 % (05/12 0402) Weight:  [83 kg (182 lb 15.7 oz)] 83 kg (182 lb 15.7 oz) (05/11 1751)  Intake/Output from previous day: 05/11 0701 - 05/12 0700 In: 4296.3 [P.O.:1080; I.V.:3216.3] Out: 4015 [Urine:3615; Blood:400] Intake/Output this shift:     Recent Labs  11/03/15 1137  HGB 12.1*    Recent Labs  11/03/15 1137  WBC 17.1*  RBC 4.37*  HCT 37.8*  PLT 220    Recent Labs  11/03/15 1137 11/04/15 0535  CREATININE 0.75 0.73   No results for input(s): LABPT, INR in the last 72 hours.  EXAM General - Patient is Alert, Appropriate and Oriented Extremity - Neurovascular intact Sensation intact distally Intact pulses distally Dorsiflexion/Plantar flexion intact No cellulitis present Dressing - dressing C/D/I and no drainage Motor Function - intact, moving foot and toes well on exam.   Past Medical History  Diagnosis Date  . Hypertension   . Diabetes mellitus without complication (HCC)     diet controlled  . Gout     Assessment/Plan:   1 Day Post-Op Procedure(s) (LRB): TOTAL HIP ARTHROPLASTY ANTERIOR APPROACH (Right) Active Problems:   Osteonecrosis of right hip (HCC)  Estimated body mass index is 30.45 kg/(m^2) as calculated from the following:   Height as of this encounter: 5\' 5"  (1.651 m).   Weight as of this encounter: 83 kg (182 lb 15.7 oz). Advance diet Up with therapy  Needs  BM Recheck labs in the am  DVT Prophylaxis - Lovenox, Foot Pumps and TED hose Weight-Bearing as tolerated to right leg D/C O2 and Pulse OX and try on Room Air  T. Cranston Neighborhris Tanaysia Bhardwaj, PA-C [facility] 11/04/2015, 7:54 AM

## 2015-11-04 NOTE — Evaluation (Signed)
Physical Therapy Evaluation Patient Details Name: Derek Townsend MRN: [id number] DOB: 1955/10/14 Today's Date: 11/04/2015   History of Present Illness  60 y/o male s/p anterior hip replacement secondary to osteonecrosis of the R hip. Pt had L hip replaced a few years ago and recovered well  Clinical Impression  Pt shows good effort with all PT acts but is very pain limited and guarded.  He was very motivated with 12 minutes of exercises but was strength and pain limited with these.  He does not have any abrupt safety issues with ambulation but is very limited and inconsistent and struggles with 10 ft of ambulation (needing a lot of encouragement do take more than just the first few steps.  Pt wants to go home with PT and should be able to if his pain calms down and he makes continued gains with ambulation.     Follow Up Recommendations Home health PT (pt will need to increase mobility before this is safe)    Equipment Recommendations       Recommendations for Other Services       Precautions / Restrictions Precautions Precautions: Anterior Hip;Fall Precaution Booklet Issued: Yes (comment) Restrictions Weight Bearing Restrictions:  (WBAT)      Mobility  Bed Mobility Overal bed mobility: Needs Assistance Bed Mobility: Supine to Sit     Supine to sit: Min guard;Min assist     General bed mobility comments: Pt shows very good effort (slow and labored) getting to EOB using other LE and UEs to slide R leg to EOB and heavy use of rails to get up but he needs very little actual physical assist.   Transfers Overall transfer level: Modified independent Equipment used: Rolling walker (2 wheeled)             General transfer comment: Pt has increased pain with the transitions to/from sit/stand but is able to do so w/o direct assist.  Pt very guarded and slow with the effort.   Ambulation/Gait Ambulation/Gait assistance: Min guard;Min assist Ambulation Distance (Feet): 10  Feet Assistive device: Rolling walker (2 wheeled)       General Gait Details: Pt pain limited and hesitant to do much walking. He is very guarded and does not display any consistency with steppage, though he does take a few bigger than shuffling steps he is unable to really show any cadence.  Stairs            Wheelchair Mobility    Modified Rankin (Stroke Patients Only)       Balance                                             Pertinent Vitals/Pain Pain Assessment: 0-10 Pain Score: 7  (increases with exercises/activity)    Home Living Family/patient expects to be discharged to:: Private residence Living Arrangements: Spouse/significant other Available Help at Discharge: Family   Home Access: Stairs to enter Entrance Stairs-Rails: Left Entrance Stairs-Number of Steps: 3   Home Equipment: Environmental consultant - 2 wheels;Cane - single point      Prior Function Level of Independence: Independent         Comments: Pt reports he was working a job that requires a lot of standing/walking.  Had no issues with ADLs, getting OOH, etc     Hand Dominance        Extremity/Trunk Assessment  Upper Extremity Assessment: Overall WFL for tasks assessed           Lower Extremity Assessment: RLE deficits/detail (pain limited, grossly 3-/5 in hip, knee and ankle 4/5)         Communication   Communication: No difficulties (English second language)  Cognition Arousal/Alertness: Awake/alert Behavior During Therapy: WFL for tasks assessed/performed Overall Cognitive Status: Within Functional Limits for tasks assessed                      General Comments      Exercises Total Joint Exercises Ankle Circles/Pumps: Strengthening;10 reps Gluteal Sets: AROM;10 reps Towel Squeeze: AROM;10 reps Short Arc Quad: Strengthening;10 reps;AROM Heel Slides: 5 reps;AAROM Hip ABduction/ADduction: AAROM;5 reps      Assessment/Plan    PT Assessment Patient  needs continued PT services  PT Diagnosis Difficulty walking;Generalized weakness;Acute pain   PT Problem List Decreased strength;Decreased activity tolerance;Decreased range of motion;Decreased mobility;Decreased coordination;Decreased balance;Decreased knowledge of use of DME;Decreased safety awareness;Decreased knowledge of precautions;Pain  PT Treatment Interventions DME instruction;Gait training;Stair training;Functional mobility training;Therapeutic activities;Therapeutic exercise;Balance training;Neuromuscular re-education;Patient/family education   PT Goals (Current goals can be found in the Care Plan section) Acute Rehab PT Goals Patient Stated Goal: go home PT Goal Formulation: With patient Time For Goal Achievement: 11/18/15 Potential to Achieve Goals: Good    Frequency BID   Barriers to discharge        Co-evaluation               End of Session Equipment Utilized During Treatment: Gait belt Activity Tolerance: Patient limited by pain Patient left: with bed alarm set;with call bell/phone within reach           Time: 0841-0915 PT Time Calculation (min) (ACUTE ONLY): 34 min   Charges:   PT Evaluation $PT Eval Low Complexity: 1 Procedure PT Treatments $Therapeutic Exercise: 8-22 mins   PT G Codes:       Loran SentersGalen Chrystie Hagwood, PT, DPT [phone]  Malachi ProGalen R Kimyatta Lecy 11/04/2015, 9:38 AM

## 2015-11-04 NOTE — Care Management (Signed)
Met again with patient to give Lovenox price ($50) and he agrees but to discuss home health PT again. He states that his wife will be here soon and he will have her call me. He states that "his wife knows someone that can do therapy". I explained that I would like to know "who" it is so that we could make sure he was having PT when he leaves and in case they want to file PT on his health insurance.

## 2015-11-04 NOTE — Care Management Note (Addendum)
Case Management Note  Patient Details  Name: Derek Townsend MRN: [id number] Date of Birth: February 17, 1956  Subjective/Objective:                  Met with patient to discuss discharge planning. He lives with his wife. He is not convinced that he will need HHPT although physical therapy is recommending it. He uses [facility] for Rx [phone]. He states he has a rolling walker and does not feel that he will need a bedside commode.   Action/Plan: List of home health agencies left with patient for review. Lovenox 12m #14 called in to [facility] Will continue to follow.   Expected Discharge Date:                  Expected Discharge Plan:     In-House Referral:     Discharge planning Services  CM Consult  Post Acute Care Choice:  Home Health Choice offered to:  Patient  DME Arranged:    DME Agency:     HH Arranged:   ([place]ission Viejolist provided) [place]     Status of Service:  In process, will continue to follow  Medicare Important Message Given:    Date Medicare IM Given:    Medicare IM give by:    Date Additional Medicare IM Given:    Additional Medicare Important Message give by:     If discussed at LPierceof Stay Meetings, dates discussed:    Additional Comments: Lovenox $50.  AMarshell Garfinkel RN 11/04/2015, 11:05 AM

## 2015-11-04 NOTE — Progress Notes (Signed)
Physical Therapy Treatment Patient Details Name: Derek Townsend MRN: [id number] DOB: 1956-04-24 Today's Date: 11/04/2015    History of Present Illness 60 y/o male s/p anterior hip replacement secondary to osteonecrosis of the R hip. Pt had L hip replaced a few years ago and recovered well    PT Comments    Pt shows increased standing and activity tolerance this afternoon, but is still pain limited and generally hesitant with exercises and standing acts.  He shows good effort but clearly is sore and painful at the incision site and is unable to do much with hip flexion against gravity.    Follow Up Recommendations  Home health PT     Equipment Recommendations       Recommendations for Other Services       Precautions / Restrictions Precautions Precautions: Anterior Hip Restrictions RLE Weight Bearing: Weight bearing as tolerated    Mobility  Bed Mobility Overal bed mobility: Needs Assistance Bed Mobility: Sit to Supine       Sit to supine: Min assist;Mod assist (lifting R LE into bed, pt made attempt with L LE assist)   General bed mobility comments: Pt able to initially lift R LE but is unable to elevate it enough to get back into bed and needs assist to lift and position once in supine  Transfers Overall transfer level: Modified independent Equipment used: Rolling walker (2 wheeled)             General transfer comment: Pt initially he seemed hesitant to sit, but with cuing and assist with positioning/sequencing he was able to control his descent back to bed  Ambulation/Gait Ambulation/Gait assistance: Min guard;Min assist Ambulation Distance (Feet): 30 Feet Assistive device: Rolling walker (2 wheeled)       General Gait Details: Pt again pain limited and generally hesitant with ambulation/WBing but is able to increased speed and confidence with ambulation this afternoon.  He initially had difficulty even clearing R toes during swing through, but is able to  improve this signficantly as he warmed up.  Pt still very slow and hesitant, but able to string a few more consistent steps together near the end of the effort.    Stairs            Wheelchair Mobility    Modified Rankin (Stroke Patients Only)       Balance                                    Cognition Arousal/Alertness: Awake/alert Behavior During Therapy: WFL for tasks assessed/performed Overall Cognitive Status: Within Functional Limits for tasks assessed                      Exercises Total Joint Exercises Ankle Circles/Pumps: Strengthening;10 reps Quad Sets: Strengthening;10 reps Gluteal Sets: Strengthening;10 reps Short Arc Quad: Strengthening;15 reps Heel Slides: 10 reps;AAROM;Strengthening Hip ABduction/ADduction: 10 reps;AROM    General Comments        Pertinent Vitals/Pain Pain Assessment: 0-10 Pain Score: 5  (at rest, increases significantly with WBing/activity)    Home Living                      Prior Function            PT Goals (current goals can now be found in the care plan section) Progress towards PT goals: Progressing toward goals  Frequency  BID    PT Plan Current plan remains appropriate    Co-evaluation             End of Session Equipment Utilized During Treatment: Gait belt Activity Tolerance: Patient limited by pain Patient left: with bed alarm set;with call bell/phone within reach     Time: 1313-1341 PT Time Calculation (min) (ACUTE ONLY): 28 min  Charges:  $Gait Training: 8-22 mins $Therapeutic Exercise: 8-22 mins                    G Codes:      Derek ProGalen R Kupono Townsend, DPT 11/04/2015, 2:43 PM

## 2015-11-04 NOTE — Evaluation (Signed)
Occupational Therapy Evaluation Patient Details Name: Derek LamasFrancisco B Townsend MRN: [id number] DOB: 12/05/1955 Today's Date: 11/04/2015    History of Present Illness 60 y/o male s/p anterior hip replacement secondary to osteonecrosis of the R hip. Pt had L hip replaced a few years ago and recovered well   Clinical Impression   Pt. Is a 60 y.o. Male who [facility] approach). Pt. Presents with pain, limited ROM, weakness, limited activity tolerance, and impaired functional mobility for ADLs. Pt. Could benefit from skilled oT services for ADL  Retraining, A/E training, review of home modifications, and work simplification strategies to improve ADL and IADL functioning.     Follow Up Recommendations  Home health OT    Equipment Recommendations       Recommendations for Other Services PT consult     Precautions / Restrictions Precautions Precautions: Anterior Hip Precaution Booklet Issued: Yes (comment) Restrictions Weight Bearing Restrictions: Yes RLE Weight Bearing: Weight bearing as tolerated      Mobility Bed Mobility Transfers                Balance                                            ADL Overall ADL's : Needs assistance/impaired Eating/Feeding: Set up (Uses his left UE for self-feeding.)   Grooming: Set up               Lower Body Dressing: Maximal assistance secondary to pain.                       Vision     Perception     Praxis      Pertinent Vitals/Pain Pain Assessment: 0-10 Pain Score: 8      Hand Dominance     Extremity/Trunk Assessment Upper Extremity Assessment Upper Extremity Assessment: RUE deficits/detail;LUE deficits/detail RUE Deficits / Details: R elbow flexion and extension limitations LUE Deficits / Details: LUE WFL       Communication Communication Communication: No difficulties (English second language)   Cognition Arousal/Alertness:  Awake/alert Behavior During Therapy: WFL for tasks assessed/performed Overall Cognitive Status: Within Functional Limits for tasks assessed                     General Comments       Exercises   Shoulder Instructions      Home Living Family/patient expects to be discharged to:: Private residence Living Arrangements: Spouse/significant other Available Help at Discharge: Family   Home Access: Stairs to enter Secretary/administratorntrance Stairs-Number of Steps: 3 Entrance Stairs-Rails: Left Home Layout: One level     Bathroom Shower/Tub: Chief Strategy OfficerTub/shower unit   Bathroom Toilet: Standard Bathroom Accessibility: Yes How Accessible: Accessible via walker Home Equipment: Walker - 2 wheels          Prior Functioning/Environment Level of Independence: Independent        Comments: Pt. was independent with IADLs, was driving, working.    OT Diagnosis:     OT Problem List: Decreased strength;Decreased range of motion;Pain;Impaired UE functional use;Decreased knowledge of use of DME or AE   OT Treatment/Interventions:      OT Goals(Current goals can be found in the care plan section) Acute Rehab OT Goals Patient Stated Goal: To go hmoe OT Goal Formulation: With patient  OT Frequency:  Barriers to D/C:            Co-evaluation              End of Session    Activity Tolerance: Patient tolerated treatment well Patient left: in chair;with chair alarm set;with family/visitor present   Time: 1610-9604 OT Time Calculation (min): 25 min Charges:  OT General Charges $OT Visit: 1 Procedure OT Evaluation $OT Eval Moderate Complexity: 1 Procedure OT Treatments $Self Care/Home Management : 8-22 mins G-Codes:    Olegario Messier, MS, OTR/L Olegario Messier 11/04/2015, 10:17 AM

## 2015-11-05 LAB — CBC
HEMATOCRIT: 33.2 % — AB (ref 40.0–52.0)
Hemoglobin: 11 g/dL — ABNORMAL LOW (ref 13.0–18.0)
MCH: 28.6 pg (ref 26.0–34.0)
MCHC: 33.1 g/dL (ref 32.0–36.0)
MCV: 86.3 fL (ref 80.0–100.0)
PLATELETS: 187 10*3/uL (ref 150–440)
RBC: 3.85 MIL/uL — ABNORMAL LOW (ref 4.40–5.90)
RDW: 14.5 % (ref 11.5–14.5)
WBC: 11.7 10*3/uL — AB (ref 3.8–10.6)

## 2015-11-05 LAB — BASIC METABOLIC PANEL
ANION GAP: 5 (ref 5–15)
BUN: 12 mg/dL (ref 6–20)
CALCIUM: 8.1 mg/dL — AB (ref 8.9–10.3)
CO2: 26 mmol/L (ref 22–32)
CREATININE: 0.79 mg/dL (ref 0.61–1.24)
Chloride: 106 mmol/L (ref 101–111)
Glucose, Bld: 115 mg/dL — ABNORMAL HIGH (ref 65–99)
Potassium: 3.8 mmol/L (ref 3.5–5.1)
SODIUM: 137 mmol/L (ref 135–145)

## 2015-11-05 MED ORDER — ONDANSETRON HCL 4 MG PO TABS: 4.0000 mg | ORAL_TABLET | Freq: Four times a day (QID) | ORAL | Status: DC | PRN

## 2015-11-05 MED ORDER — OXYCODONE HCL 5 MG PO TABS: 5.0000 mg | ORAL_TABLET | ORAL | Status: DC | PRN

## 2015-11-05 MED ORDER — ENOXAPARIN SODIUM 40 MG/0.4ML ~~LOC~~ SOLN: 40.0000 mg | SUBCUTANEOUS | Status: DC

## 2015-11-05 NOTE — Discharge Summary (Signed)
Physician Discharge Summary  Patient ID: DELVONTE BERENSON MRN: [id number] DOB/AGE: [birth year] 60 y.o.  Admit date: 11/03/2015 Discharge date: 11/06/2015 Admission Diagnoses:  OSTEOARTHRITIS   Discharge Diagnoses: Patient Active Problem List   Diagnosis Date Noted  . Osteonecrosis of right hip (HCC) 11/03/2015    Past Medical History  Diagnosis Date  . Hypertension   . Diabetes mellitus without complication (HCC)     diet controlled  . Gout      Transfusion: none   Consultants (if any):    Discharged Condition: Improved  Hospital Course: RANDELL TEARE is an 60 y.o. male who was admitted 11/03/2015 with a diagnosis of right hip avascular necrosis and went to the operating room on 11/03/2015 and underwent the above named procedures.    Surgeries: Procedure(s): TOTAL HIP ARTHROPLASTY ANTERIOR APPROACH on 11/03/2015 Patient tolerated the surgery well. Taken to PACU where she was stabilized and then transferred to the orthopedic floor.  Started on Lovenox 40 q 24 hrs. Foot pumps applied bilaterally at 80 mm. Heels elevated on bed with rolled towels. No evidence of DVT. Negative Homan. Physical therapy started on day #1 for gait training and transfer. OT started day #1 for ADL and assisted devices.  Patient's foley was d/c on day #1. Patient's IV was d/c on day #2.  On post op day #3 patient was stable and ready for discharge to home with HHPT.  Implants: Medacta AMIS 4 standard stem with 56 mm Mpact cup DM and liner with S 28 mm head  He was given perioperative antibiotics:  Anti-infectives    Start     Dose/Rate Route Frequency Ordered Stop   11/03/15 1045  ceFAZolin (ANCEF) IVPB 2g/100 mL premix     2 g 200 mL/hr over 30 Minutes Intravenous Every 6 hours 11/03/15 1039 11/03/15 2129   11/03/15 0549  ceFAZolin (ANCEF) 2-4 GM/100ML-% IVPB    Comments:  KENNEDY, ASHLEY: cabinet override      11/03/15 0549 11/03/15 1759   11/03/15 0300  ceFAZolin (ANCEF) IVPB 2g/100 mL  premix     2 g 200 mL/hr over 30 Minutes Intravenous  Once 11/03/15 0253 11/03/15 0743    .  He was given sequential compression devices, early ambulation, and lovenox for DVT prophylaxis.  He benefited maximally from the hospital stay and there were no complications.    Recent vital signs:  Filed Vitals:   11/05/15 0400 11/05/15 0801  BP: 113/71 115/72  Pulse: 86 88  Temp: 98.8 F (37.1 C) 98.9 F (37.2 C)  Resp:      Recent laboratory studies:  Lab Results  Component Value Date   HGB 11.0* 11/05/2015   HGB 12.1* 11/03/2015   HGB 13.8 10/20/2015   Lab Results  Component Value Date   WBC 11.7* 11/05/2015   PLT 187 11/05/2015   Lab Results  Component Value Date   INR 0.99 10/20/2015   Lab Results  Component Value Date   NA 137 11/05/2015   K 3.8 11/05/2015   CL 106 11/05/2015   CO2 26 11/05/2015   BUN 12 11/05/2015   CREATININE 0.79 11/05/2015   GLUCOSE 115* 11/05/2015    Discharge Medications:     Medication List    STOP taking these medications        traMADol 50 MG tablet  Commonly known as:  ULTRAM      TAKE these medications        acetaminophen 325 MG tablet  Commonly known as:  TYLENOL  Take 650 mg by mouth every 6 (six) hours as needed.     allopurinol 100 MG tablet  Commonly known as:  ZYLOPRIM  Take 200 mg by mouth daily.     amLODipine 10 MG tablet  Commonly known as:  NORVASC  Take 10 mg by mouth daily.     enoxaparin 40 MG/0.4ML injection  Commonly known as:  LOVENOX  Inject 0.4 mLs (40 mg total) into the skin daily. X 14 days     meloxicam 7.5 MG tablet  Commonly known as:  MOBIC  Take 7.5 mg by mouth daily.     ondansetron 4 MG tablet  Commonly known as:  ZOFRAN  Take 1 tablet (4 mg total) by mouth every 6 (six) hours as needed for nausea.     oxyCODONE 5 MG immediate release tablet  Commonly known as:  Oxy IR/ROXICODONE  Take 1-2 tablets (5-10 mg total) by mouth every 3 (three) hours as needed for breakthrough pain.      pravastatin 40 MG tablet  Commonly known as:  PRAVACHOL  Take 40 mg by mouth at bedtime.        Diagnostic Studies: Dg Hip Operative Unilat W Or W/o Pelvis Right  11/03/2015  CLINICAL DATA:  Evaluation of hardware placement for right hip replacement EXAM: OPERATIVE right HIP (WITH PELVIS IF PERFORMED) 1 VIEWS TECHNIQUE: Fluoroscopic spot image(s) were submitted for interpretation post-operatively. COMPARISON:  Right hip films of 02/19/2011 FINDINGS: The right hip hemiarthroplasty components are in good position. No complicating features are seen on the single C-arm spot film obtained. IMPRESSION: Right hip hemiarthroplasty in good position with no complications noted. Electronically Signed   By: Dwyane DeePaul  Barry M.D.   On: 11/03/2015 09:10   Dg Hip Unilat W Or W/o Pelvis 2-3 Views Right  11/03/2015  CLINICAL DATA:  Status post right hip replacement. EXAM: DG HIP (WITH OR WITHOUT PELVIS) 2-3V RIGHT COMPARISON:  Same day. FINDINGS: Right hip prosthesis appears to be well situated. Expected postsurgical changes are seen in the surrounding soft tissues. No fracture or dislocation is noted. IMPRESSION: Status post hemiarthroplasty of right hip. Electronically Signed   By: Lupita RaiderJames  Green Jr, M.D.   On: 11/03/2015 09:54    Disposition:         Follow-up Information    Follow up with MENZ,MICHAEL, MD In 2 weeks.   Specialty:  Orthopedic Surgery   Why:  For staple removal and skin check   Contact information:   [address] [facility] [place] [address] [phone]        Signed: Amador CunasGAINES, Tyger Wichman [facility] Inc[facility]RISTOPHER 11/05/2015, 8:34 AM

## 2015-11-05 NOTE — Progress Notes (Signed)
Occupational Therapy Treatment Patient Details Name: Derek Townsend MRN: [id number] DOB: 05/08/1956 Today's Date: 11/05/2015    History of present illness 60 y/o male s/p anterior hip replacement secondary to osteonecrosis of the R hip. Pt had L hip replaced a few years ago and recovered well   OT comments  60 yo male s/p anterior R hip replacement presenting with 8/10 pain and decreased functional mobility for ADL. Pt educated in use of AE for LB dressing. Pt could benefit from continued skilled OT services for ADL retraining, AE training, and functional ADL mobility training to increase ADL/IADL functional independence in order to return to PLOF.   Follow Up Recommendations  Home health OT    Equipment Recommendations       Recommendations for Other Services      Precautions / Restrictions Precautions Precautions: Anterior Hip Restrictions Weight Bearing Restrictions: Yes RLE Weight Bearing: Weight bearing as tolerated       Mobility Bed Mobility               General bed mobility comments: Pt up in recliner for session  Transfers                      Balance                                   ADL Overall ADL's : Needs assistance/impaired                     Lower Body Dressing: Minimal assistance;With adaptive equipment Lower Body Dressing Details (indicate cue type and reason): Pt doffed/donned socks using sock aid and reacher with Min A after initial instruction                      Vision                     Perception     Praxis      Cognition   Behavior During Therapy: WFL for tasks assessed/performed Overall Cognitive Status: Within Functional Limits for tasks assessed                       Extremity/Trunk Assessment               Exercises     Shoulder Instructions       General Comments      Pertinent Vitals/ Pain       Pain Assessment: 0-10 Pain Score: 8  Pain  Location: R hip Pain Intervention(s): Limited activity within patient's tolerance;Monitored during session;Premedicated before session  Home Living                                          Prior Functioning/Environment              Frequency       Progress Toward Goals  OT Goals(current goals can now be found in the care plan section)  Progress towards OT goals: Progressing toward goals  Acute Rehab OT Goals Patient Stated Goal: to go home OT Goal Formulation: With patient  Plan Discharge plan remains appropriate    Co-evaluation                 End  of Session     Activity Tolerance Patient tolerated treatment well;Patient limited by pain   Patient Left in chair;with call bell/phone within reach;with chair alarm set   Nurse Communication          Time: 1610-9604 OT Time Calculation (min): 12 min  Charges: OT General Charges $OT Visit: 1 Procedure OT Treatments $Self Care/Home Management : 8-22 mins  Eliezer Bottom, OTR/L 11/05/2015, 11:41 AM

## 2015-11-05 NOTE — Discharge Instructions (Signed)

## 2015-11-05 NOTE — Progress Notes (Signed)
Physical Therapy Treatment Patient Details Name: Derek Townsend MRN: [id number] DOB: 03-06-1956 Today's Date: 11/05/2015    History of Present Illness 60 y/o male s/p anterior hip replacement secondary to osteonecrosis of the R hip. Pt had L hip replaced a few years ago and recovered well    PT Comments    Pt does well with PT and is able to circumambulate the nurses' station with relatively consistent cadence.  He is still having considerable post-op pain but it does not functionally limit him too much with mobility and ambulation.  Hip flexion against gravity is still very limited but otherwise pt is progressing nicely.   Follow Up Recommendations  Home health PT     Equipment Recommendations       Recommendations for Other Services       Precautions / Restrictions Precautions Precautions: Anterior Hip Restrictions RLE Weight Bearing: Weight bearing as tolerated    Mobility  Bed Mobility Overal bed mobility: Modified Independent Bed Mobility: Sit to Supine     Supine to sit: Modified independent (Device/Increase time)     General bed mobility comments: Pt is able to get R LE back into bed from sitting but is very labored with the effort and does need UEs and L LE  Transfers Overall transfer level: Modified independent Equipment used: Rolling walker (2 wheeled)             General transfer comment: Pt is slow and cautious with getting to standing, but does so w/o physical assist  Ambulation/Gait Ambulation/Gait assistance: Supervision Ambulation Distance (Feet): 200 Feet Assistive device: Rolling walker (2 wheeled)       General Gait Details: Pt is able to maintain consistent cadence/speed with ambulation this afternoon and though he still is hesitant secondary to pain he has little fatigue and no safety issues.   Stairs            Wheelchair Mobility    Modified Rankin (Stroke Patients Only)       Balance                                    Cognition Arousal/Alertness: Awake/alert Behavior During Therapy: WFL for tasks assessed/performed Overall Cognitive Status: Within Functional Limits for tasks assessed                      Exercises Total Joint Exercises Ankle Circles/Pumps: Strengthening;10 reps Quad Sets: Strengthening;15 reps Gluteal Sets: Strengthening;15 reps Towel Squeeze: AROM;10 reps Short Arc Quad: Strengthening;15 reps Heel Slides: 10 reps;AAROM;Strengthening Hip ABduction/ADduction: 10 reps;AROM    General Comments        Pertinent Vitals/Pain Pain Score: 6     Home Living                      Prior Function            PT Goals (current goals can now be found in the care plan section) Progress towards PT goals: Progressing toward goals    Frequency  BID    PT Plan Current plan remains appropriate    Co-evaluation             End of Session Equipment Utilized During Treatment: Gait belt Activity Tolerance: Patient limited by pain Patient left: with call bell/phone within reach;with chair alarm set     Time: 1444-1516 PT Time Calculation (min) (ACUTE ONLY): 32 min  Charges:  $Gait Training: 8-22 mins $Therapeutic Exercise: 8-22 mins                    G Codes:      Derek Townsend, DPT 11/05/2015, 5:17 PM

## 2015-11-05 NOTE — Progress Notes (Signed)
Physical Therapy Treatment Patient Details Name: Derek LamasFrancisco B Constante MRN: [id number] DOB: 05/01/1956 Today's Date: 11/05/2015    History of Present Illness 60 y/o male s/p anterior hip replacement secondary to osteonecrosis of the R hip. Pt had L hip replaced a few years ago and recovered well    PT Comments    Pt is able to increase ambulation speed, distance and form but is still pain limited and guarded with the effort.  Organized session ~45 minutes post pain meds, but he still struggles with this.  Pt was able to negotiate up/down steps w/o needing direct assistance and is able to use retro-strategy with walker and was able to remain safe.  Pt very tired after the session and c/o significant pain.   Follow Up Recommendations  Home health PT     Equipment Recommendations       Recommendations for Other Services       Precautions / Restrictions Precautions Precautions: Anterior Hip Restrictions Weight Bearing Restrictions: Yes RLE Weight Bearing: Weight bearing as tolerated    Mobility  Bed Mobility Overal bed mobility: Modified Independent Bed Mobility: Supine to Sit     Supine to sit: Modified independent (Device/Increase time)     General bed mobility comments: Pt again slow with the effort, but was able to use L LE to assist R and does need UEs on rails  Transfers Overall transfer level: Modified independent Equipment used: Rolling walker (2 wheeled)             General transfer comment: Pt is slow and cautious with getting to standing, but does so w/o physical assist  Ambulation/Gait Ambulation/Gait assistance: Supervision Ambulation Distance (Feet): 100 Feet Assistive device: Rolling walker (2 wheeled)       General Gait Details: Pt is able to do some prolonged ambulation (with 1 seated break) but is still slow and hesitant.  He is able to take more consistent steps and clear R toes easier but he is not overly confident.   Stairs Stairs: Yes Stairs  assistance: Min guard Stair Management: Two rails;Backwards;With walker Number of Stairs: 4 General stair comments: Pt did steps with b/l rails as practice, and then backward with walker as this is how he will have to do it at home.  Pt is again slow and guarded, but able to negotiate the steps w/o direct physical assist  Wheelchair Mobility    Modified Rankin (Stroke Patients Only)       Balance                                    Cognition Arousal/Alertness: Awake/alert Behavior During Therapy: WFL for tasks assessed/performed Overall Cognitive Status: Within Functional Limits for tasks assessed                      Exercises Total Joint Exercises Ankle Circles/Pumps: Strengthening;10 reps Quad Sets: Strengthening;15 reps Gluteal Sets: Strengthening;15 reps Short Arc Quad: Strengthening;15 reps Heel Slides: 10 reps;AAROM;Strengthening Hip ABduction/ADduction: 10 reps;AROM    General Comments        Pertinent Vitals/Pain Pain Assessment: 0-10 Pain Score: 5  (increases with ambulation and exercises) Pain Location: R hip Pain Intervention(s): Limited activity within patient's tolerance;Monitored during session;Premedicated before session    Home Living                      Prior Function  PT Goals (current goals can now be found in the care plan section) Acute Rehab PT Goals Patient Stated Goal: to go home Progress towards PT goals: Progressing toward goals    Frequency  BID    PT Plan Current plan remains appropriate    Co-evaluation             End of Session Equipment Utilized During Treatment: Gait belt Activity Tolerance: Patient limited by pain Patient left: with call bell/phone within reach;with chair alarm set     Time: 1610-9604 PT Time Calculation (min) (ACUTE ONLY): 31 min  Charges:  $Gait Training: 8-22 mins $Therapeutic Exercise: 8-22 mins                    G Codes:      [name], DPT  11/05/2015, 12:29 PM

## 2015-11-05 NOTE — Progress Notes (Signed)
   Subjective: 2 Days Post-Op Procedure(s) (LRB): TOTAL HIP ARTHROPLASTY ANTERIOR APPROACH (Right) Patient reports pain as mild.   Patient is well, and has had no acute complaints or problems Denies any CP, SOB, ABD pain. We will continue therapy today.  Plan is to go Home after hospital stay.  Objective: Vital signs in last 24 hours: Temp:  [97.8 F (36.6 C)-99 F (37.2 C)] 98.9 F (37.2 C) (05/13 0801) Pulse Rate:  [80-88] 88 (05/13 0801) Resp:  [18] 18 (05/12 2044) BP: (113-124)/(68-72) 115/72 mmHg (05/13 0801) SpO2:  [95 %-99 %] 96 % (05/13 0801)  Intake/Output from previous day: 05/12 0701 - 05/13 0700 In: 2495 [P.O.:770; I.V.:1725] Out: 2200 [Urine:2200] Intake/Output this shift:     Recent Labs  11/03/15 1137 11/05/15 0353  HGB 12.1* 11.0*    Recent Labs  11/03/15 1137 11/05/15 0353  WBC 17.1* 11.7*  RBC 4.37* 3.85*  HCT 37.8* 33.2*  PLT 220 187    Recent Labs  11/04/15 0535 11/05/15 0353  NA 135 137  K 3.8 3.8  CL 104 106  CO2 25 26  BUN 10 12  CREATININE 0.80  0.73 0.79  GLUCOSE 120* 115*  CALCIUM 8.2* 8.1*   No results for input(s): LABPT, INR in the last 72 hours.  EXAM General - Patient is Alert, Appropriate and Oriented Extremity - Neurovascular intact Sensation intact distally Intact pulses distally Dorsiflexion/Plantar flexion intact No cellulitis present Dressing - dressing C/D/I and no drainage Motor Function - intact, moving foot and toes well on exam.   Past Medical History  Diagnosis Date  . Hypertension   . Diabetes mellitus without complication (HCC)     diet controlled  . Gout     Assessment/Plan:   2 Days Post-Op Procedure(s) (LRB): TOTAL HIP ARTHROPLASTY ANTERIOR APPROACH (Right) Active Problems:   Osteonecrosis of right hip (HCC)  Estimated body mass index is 30.45 kg/(m^2) as calculated from the following:   Height as of this encounter: 5\' 5"  (1.651 m).   Weight as of this encounter: 83 kg (182 lb 15.7  oz). Advance diet Up with therapy  Needs BM Plan on discharge to home with home health physical therapy today or tomorrow pending bowel movement and progress with physical therapy.  DVT Prophylaxis - Lovenox, Foot Pumps and TED hose Weight-Bearing as tolerated to right leg D/C O2 and Pulse OX and try on Room Air  T. Cranston Neighborhris Jayson Waterhouse, PA-C [facility] 11/05/2015, 8:26 AM

## 2015-11-05 NOTE — Care Management Note (Addendum)
Case Management Note  Patient Details  Name: Jarome LamasFrancisco B Goyne MRN: [id number] Date of Birth: 12/06/1955  Subjective/Objective:    Discussed discharge planning with Mrs Ernestina ColumbiaRocha at Mr Vanbrocklin's insistence. Mrs Ernestina ColumbiaRocha has a friend who works at [employer] and she chose [facility] to be Mr Rochas home health provider for HH=PT. Mr Ernestina ColumbiaRocha already has a RW. He declines a BSC. Dr Rosita KeaMenz, Orthopedic Surgeon will be available for home health to call or obtain orders from if needed.   Action/Plan:   Expected Discharge Date:                  Expected Discharge Plan:     In-House Referral:     Discharge planning Services  CM Consult  Post Acute Care Choice:  Home Health Choice offered to:  Patient  DME Arranged:    DME Agency:     HH Arranged:   (HH list provided) HH Agency:     Status of Service:  In process, will continue to follow  Medicare Important Message Given:    Date Medicare IM Given:    Medicare IM give by:    Date Additional Medicare IM Given:    Additional Medicare Important Message give by:     If discussed at Long Length of Stay Meetings, dates discussed:    Additional Comments:  Samirah Scarpati A, RN 11/05/2015, 3:41 PM

## 2015-11-05 NOTE — Plan of Care (Signed)
Problem: Acute Rehab OT Goals (only OT should resolve) Goal: Pt. Will Perform Lower Body Dressing Outcome: Progressing Pt able to don/doff socks using AE with Min A and VC for technique.

## 2015-11-05 NOTE — Progress Notes (Signed)
Pt progressed to all goals with PT this shift, had large BM after multiple interventions. Pt cleared for discharge, stated he would rather wait until tomorrow.

## 2015-11-06 NOTE — Progress Notes (Signed)
Physical Therapy Treatment Patient Details Name: Derek Townsend MRN: [id number] DOB: June 27, 1955 Today's Date: 11/06/2015    History of Present Illness 60 y/o male s/p anterior hip replacement secondary to osteonecrosis of the R hip. Pt had L hip replaced a few years ago and recovered well    PT Comments    Patient demonstrates improvements in bed mobility and tolerance of gait since last session. Mild tendency to shrug shoulders when ambulating, so verbal/tactile cues utilized to correct. Patient able to navigate stairs with CGA and bilateral rails with good knowledge of safety awareness. Current plan still remains appropriate. If patient still in hospital tomorrow, he will benefit from further progression in gait training to return to PLOF.  Follow Up Recommendations  Home health PT     Equipment Recommendations  None recommended by PT    Recommendations for Other Services       Precautions / Restrictions Precautions Precautions: Anterior Hip Precaution Booklet Issued: Yes (comment) Restrictions Weight Bearing Restrictions: Yes RLE Weight Bearing: Weight bearing as tolerated    Mobility  Bed Mobility Overal bed mobility: Modified Independent Bed Mobility: Sit to Supine;Supine to Sit           General bed mobility comments: Patient modified independent in performing all bed mobility. Utilizes L LE to hook R LE getting in/out of bed. Able to push and bridge to scoot to [facility].  Transfers Overall transfer level: Modified independent Equipment used: Rolling walker (2 wheeled)             General transfer comment: Patient demonstrates good safety awareness moving from sit to stand and stand to sit.  Ambulation/Gait Ambulation/Gait assistance: Modified independent (Device/Increase time) Ambulation Distance (Feet): 220 Feet Assistive device: Rolling walker (2 wheeled)       General Gait Details: Patient ambulates at decreased cadence with mild decrease in TKE at  heel strike on R. Required cues to not step too close to front of RW and to lower shoulders due to increased tendency to shrug.   Stairs Stairs: Yes Stairs assistance: Min guard Stair Management: Two rails Number of Stairs: 4 General stair comments: Patient demonstrates good concentric/eccentric control performing stairs with B rails.  Wheelchair Mobility    Modified Rankin (Stroke Patients Only)       Balance Overall balance assessment: Needs assistance Sitting-balance support: Feet supported Sitting balance-Leahy Scale: Good     Standing balance support: Bilateral upper extremity supported Standing balance-Leahy Scale: Good                      Cognition Arousal/Alertness: Awake/alert Behavior During Therapy: WFL for tasks assessed/performed Overall Cognitive Status: Within Functional Limits for tasks assessed                      Exercises Total Joint Exercises Ankle Circles/Pumps: AROM;20 reps Quad Sets: 15 reps;Strengthening Gluteal Sets: Strengthening;15 reps Short Arc Quad: Strengthening;15 reps Heel Slides: Strengthening;10 reps Hip ABduction/ADduction: Strengthening;10 reps Straight Leg Raises: AAROM;10 reps    General Comments        Pertinent Vitals/Pain Pain Assessment: 0-10 Pain Score: 5  Pain Location: R hip Pain Descriptors / Indicators: Aching Pain Intervention(s): Limited activity within patient's tolerance;Monitored during session;Premedicated before session;Ice applied    Home Living                      Prior Function            PT  Goals (current goals can now be found in the care plan section) Acute Rehab PT Goals Patient Stated Goal: To go home PT Goal Formulation: With patient Time For Goal Achievement: 11/18/15 Potential to Achieve Goals: Good Progress towards PT goals: Progressing toward goals    Frequency  BID    PT Plan Current plan remains appropriate    Co-evaluation             End  of Session Equipment Utilized During Treatment: Gait belt Activity Tolerance: Patient limited by pain Patient left: in bed;with call bell/phone within reach;with bed alarm set;with family/visitor present     Time: 0850-0920 PT Time Calculation (min) (ACUTE ONLY): 30 min  Charges:  $Gait Training: 8-22 mins $Therapeutic Exercise: 8-22 mins                    G Codes:      Neita CarpJulie Ann Landin Tallon, PT, DPT 11/06/2015, 9:21 AM

## 2015-11-06 NOTE — Progress Notes (Signed)
   Subjective: 3 Days Post-Op Procedure(s) (LRB): TOTAL HIP ARTHROPLASTY ANTERIOR APPROACH (Right) Patient reports pain as mild.   Patient is well, and has had no acute complaints or problems Denies any CP, SOB, ABD pain. We will continue therapy today.  Plan is to go Home today.  Objective: Vital signs in last 24 hours: Temp:  [98.2 F (36.8 C)-98.9 F (37.2 C)] 98.2 F (36.8 C) (05/14 0732) Pulse Rate:  [66-88] 66 (05/14 0732) Resp:  [16-18] 18 (05/14 0449) BP: (98-120)/(63-72) 104/63 mmHg (05/14 0732) SpO2:  [96 %-98 %] 98 % (05/14 0732)  Intake/Output from previous day: 05/13 0701 - 05/14 0700 In: -  Out: 1450 [Urine:1450] Intake/Output this shift:     Recent Labs  11/03/15 1137 11/05/15 0353  HGB 12.1* 11.0*    Recent Labs  11/03/15 1137 11/05/15 0353  WBC 17.1* 11.7*  RBC 4.37* 3.85*  HCT 37.8* 33.2*  PLT 220 187    Recent Labs  11/04/15 0535 11/05/15 0353  NA 135 137  K 3.8 3.8  CL 104 106  CO2 25 26  BUN 10 12  CREATININE 0.80  0.73 0.79  GLUCOSE 120* 115*  CALCIUM 8.2* 8.1*   No results for input(s): LABPT, INR in the last 72 hours.  EXAM General - Patient is Alert, Appropriate and Oriented Extremity - Neurovascular intact Sensation intact distally Intact pulses distally Dorsiflexion/Plantar flexion intact No cellulitis present Dressing - dressing C/D/I and no drainage, dressing changed. Motor Function - intact, moving foot and toes well on exam.   Past Medical History  Diagnosis Date  . Hypertension   . Diabetes mellitus without complication (HCC)     diet controlled  . Gout     Assessment/Plan:   3 Days Post-Op Procedure(s) (LRB): TOTAL HIP ARTHROPLASTY ANTERIOR APPROACH (Right) Active Problems:   Osteonecrosis of right hip (HCC)  Estimated body mass index is 30.45 kg/(m^2) as calculated from the following:   Height as of this encounter: 5\' 5"  (1.651 m).   Weight as of this encounter: 83 kg (182 lb 15.7 oz). Advance  diet Up with therapy  Discharge home with HHPT today. Follow up with [facility] in 2 weeks for staple removal.   DVT Prophylaxis - Lovenox, Foot Pumps and TED hose Weight-Bearing as tolerated to right leg D/C O2 and Pulse OX and try on Room Air  T. Cranston Neighborhris Gaines, PA-C [facility] 11/06/2015, 7:53 AM

## 2015-11-06 NOTE — Progress Notes (Signed)
Pt discharged to home this AM with his wife. Discharge instructions reviewed and prescriptions given. Pt will schedule follow up with Dr. Rosita KeaMenz in 2 weeks.

## 2015-11-07 LAB — SURGICAL PATHOLOGY

## 2020-07-04 ENCOUNTER — Emergency Department: Payer: Medicare Other

## 2020-07-04 ENCOUNTER — Inpatient Hospital Stay: Payer: Medicare Other

## 2020-07-04 ENCOUNTER — Inpatient Hospital Stay
Admission: EM | Admit: 2020-07-04 | Discharge: 2020-07-26 | DRG: 871 | Disposition: E | Payer: Medicare Other | Attending: Internal Medicine | Admitting: Internal Medicine

## 2020-07-04 ENCOUNTER — Other Ambulatory Visit: Payer: Self-pay

## 2020-07-04 ENCOUNTER — Encounter: Payer: Self-pay | Admitting: Emergency Medicine

## 2020-07-04 DIAGNOSIS — Z515 Encounter for palliative care: Secondary | ICD-10-CM

## 2020-07-04 DIAGNOSIS — Z791 Long term (current) use of non-steroidal anti-inflammatories (NSAID): Secondary | ICD-10-CM

## 2020-07-04 DIAGNOSIS — E874 Mixed disorder of acid-base balance: Secondary | ICD-10-CM | POA: Diagnosis present

## 2020-07-04 DIAGNOSIS — J9601 Acute respiratory failure with hypoxia: Secondary | ICD-10-CM | POA: Diagnosis not present

## 2020-07-04 DIAGNOSIS — N179 Acute kidney failure, unspecified: Secondary | ICD-10-CM | POA: Diagnosis present

## 2020-07-04 DIAGNOSIS — I10 Essential (primary) hypertension: Secondary | ICD-10-CM | POA: Diagnosis present

## 2020-07-04 DIAGNOSIS — K7200 Acute and subacute hepatic failure without coma: Secondary | ICD-10-CM | POA: Diagnosis not present

## 2020-07-04 DIAGNOSIS — E875 Hyperkalemia: Secondary | ICD-10-CM | POA: Diagnosis present

## 2020-07-04 DIAGNOSIS — R34 Anuria and oliguria: Secondary | ICD-10-CM | POA: Diagnosis not present

## 2020-07-04 DIAGNOSIS — E11649 Type 2 diabetes mellitus with hypoglycemia without coma: Secondary | ICD-10-CM | POA: Diagnosis not present

## 2020-07-04 DIAGNOSIS — M109 Gout, unspecified: Secondary | ICD-10-CM | POA: Diagnosis present

## 2020-07-04 DIAGNOSIS — I214 Non-ST elevation (NSTEMI) myocardial infarction: Secondary | ICD-10-CM | POA: Diagnosis present

## 2020-07-04 DIAGNOSIS — E871 Hypo-osmolality and hyponatremia: Secondary | ICD-10-CM | POA: Diagnosis present

## 2020-07-04 DIAGNOSIS — R6521 Severe sepsis with septic shock: Secondary | ICD-10-CM | POA: Diagnosis present

## 2020-07-04 DIAGNOSIS — J1282 Pneumonia due to coronavirus disease 2019: Secondary | ICD-10-CM | POA: Diagnosis present

## 2020-07-04 DIAGNOSIS — A4189 Other specified sepsis: Secondary | ICD-10-CM | POA: Diagnosis present

## 2020-07-04 DIAGNOSIS — G928 Other toxic encephalopathy: Secondary | ICD-10-CM | POA: Diagnosis present

## 2020-07-04 DIAGNOSIS — Z96641 Presence of right artificial hip joint: Secondary | ICD-10-CM | POA: Diagnosis present

## 2020-07-04 DIAGNOSIS — R579 Shock, unspecified: Secondary | ICD-10-CM | POA: Diagnosis not present

## 2020-07-04 DIAGNOSIS — J80 Acute respiratory distress syndrome: Secondary | ICD-10-CM | POA: Diagnosis present

## 2020-07-04 DIAGNOSIS — Z87891 Personal history of nicotine dependence: Secondary | ICD-10-CM

## 2020-07-04 DIAGNOSIS — R0603 Acute respiratory distress: Secondary | ICD-10-CM

## 2020-07-04 DIAGNOSIS — Z66 Do not resuscitate: Secondary | ICD-10-CM | POA: Diagnosis present

## 2020-07-04 DIAGNOSIS — Z79899 Other long term (current) drug therapy: Secondary | ICD-10-CM | POA: Diagnosis not present

## 2020-07-04 DIAGNOSIS — U071 COVID-19: Secondary | ICD-10-CM | POA: Diagnosis present

## 2020-07-04 DIAGNOSIS — R778 Other specified abnormalities of plasma proteins: Secondary | ICD-10-CM | POA: Diagnosis not present

## 2020-07-04 DIAGNOSIS — R0902 Hypoxemia: Secondary | ICD-10-CM

## 2020-07-04 DIAGNOSIS — J9602 Acute respiratory failure with hypercapnia: Secondary | ICD-10-CM | POA: Diagnosis not present

## 2020-07-04 DIAGNOSIS — Z452 Encounter for adjustment and management of vascular access device: Secondary | ICD-10-CM

## 2020-07-04 LAB — CBC WITH DIFFERENTIAL/PLATELET
Abs Immature Granulocytes: 1.76 10*3/uL — ABNORMAL HIGH (ref 0.00–0.07)
Basophils Absolute: 0.1 10*3/uL (ref 0.0–0.1)
Basophils Relative: 1 %
Eosinophils Absolute: 0 10*3/uL (ref 0.0–0.5)
Eosinophils Relative: 0 %
HCT: 37.5 % — ABNORMAL LOW (ref 39.0–52.0)
Hemoglobin: 12.7 g/dL — ABNORMAL LOW (ref 13.0–17.0)
Immature Granulocytes: 8 %
Lymphocytes Relative: 10 %
Lymphs Abs: 2.1 10*3/uL (ref 0.7–4.0)
MCH: 28 pg (ref 26.0–34.0)
MCHC: 33.9 g/dL (ref 30.0–36.0)
MCV: 82.8 fL (ref 80.0–100.0)
Monocytes Absolute: 0.6 10*3/uL (ref 0.1–1.0)
Monocytes Relative: 3 %
Neutro Abs: 17 10*3/uL — ABNORMAL HIGH (ref 1.7–7.7)
Neutrophils Relative %: 78 %
Platelets: 208 10*3/uL (ref 150–400)
RBC: 4.53 MIL/uL (ref 4.22–5.81)
RDW: 14.5 % (ref 11.5–15.5)
Smear Review: NORMAL
WBC: 21.5 10*3/uL — ABNORMAL HIGH (ref 4.0–10.5)
nRBC: 0.3 % — ABNORMAL HIGH (ref 0.0–0.2)

## 2020-07-04 LAB — COMPREHENSIVE METABOLIC PANEL
ALT: 317 U/L — ABNORMAL HIGH (ref 0–44)
AST: 480 U/L — ABNORMAL HIGH (ref 15–41)
Albumin: 2.7 g/dL — ABNORMAL LOW (ref 3.5–5.0)
Alkaline Phosphatase: 111 U/L (ref 38–126)
Anion gap: 26 — ABNORMAL HIGH (ref 5–15)
BUN: 17 mg/dL (ref 8–23)
CO2: 13 mmol/L — ABNORMAL LOW (ref 22–32)
Calcium: 8.4 mg/dL — ABNORMAL LOW (ref 8.9–10.3)
Chloride: 90 mmol/L — ABNORMAL LOW (ref 98–111)
Creatinine, Ser: 1.51 mg/dL — ABNORMAL HIGH (ref 0.61–1.24)
GFR, Estimated: 51 mL/min — ABNORMAL LOW (ref >60)
Glucose, Bld: 173 mg/dL — ABNORMAL HIGH (ref 70–99)
Potassium: 4.1 mmol/L (ref 3.5–5.1)
Sodium: 129 mmol/L — ABNORMAL LOW (ref 135–145)
Total Bilirubin: 1.9 mg/dL — ABNORMAL HIGH (ref 0.3–1.2)
Total Protein: 7.7 g/dL (ref 6.5–8.1)

## 2020-07-04 LAB — TROPONIN I (HIGH SENSITIVITY)
Troponin I (High Sensitivity): 606 ng/L (ref <18)
Troponin I (High Sensitivity): 614 ng/L (ref <18)

## 2020-07-04 LAB — BLOOD GAS, ARTERIAL
Acid-base deficit: 13.5 mmol/L — ABNORMAL HIGH (ref 0.0–2.0)
Bicarbonate: 17 mmol/L — ABNORMAL LOW (ref 20.0–28.0)
FIO2: 1
MECHVT: 480 mL
O2 Saturation: 69.6 %
Patient temperature: 37
RATE: 20 resp/min
pCO2 arterial: 60 mmHg — ABNORMAL HIGH (ref 32.0–48.0)
pH, Arterial: 7.06 — CL (ref 7.350–7.450)
pO2, Arterial: 53 mmHg — ABNORMAL LOW (ref 83.0–108.0)

## 2020-07-04 LAB — LACTIC ACID, PLASMA
Lactic Acid, Venous: >11 mmol/L (ref 0.5–1.9)
Lactic Acid, Venous: >11 mmol/L (ref 0.5–1.9)
Lactic Acid, Venous: >11 mmol/L (ref 0.5–1.9)

## 2020-07-04 LAB — POC SARS CORONAVIRUS 2 AG -  ED: SARS Coronavirus 2 Ag: POSITIVE — AB

## 2020-07-04 LAB — BRAIN NATRIURETIC PEPTIDE: B Natriuretic Peptide: 240.2 pg/mL — ABNORMAL HIGH (ref 0.0–100.0)

## 2020-07-04 LAB — PROTIME-INR
INR: 2.2 — ABNORMAL HIGH (ref 0.8–1.2)
Prothrombin Time: 23.4 seconds — ABNORMAL HIGH (ref 11.4–15.2)

## 2020-07-04 LAB — APTT: aPTT: 48 seconds — ABNORMAL HIGH (ref 24–36)

## 2020-07-04 LAB — MAGNESIUM: Magnesium: 2.8 mg/dL — ABNORMAL HIGH (ref 1.7–2.4)

## 2020-07-04 MED ORDER — HYDROCORTISONE NA SUCCINATE PF 100 MG IJ SOLR
50.0000 mg | Freq: Four times a day (QID) | INTRAMUSCULAR | Status: DC
  Administered 2020-07-05 – 2020-07-06 (×6): 50 mg via INTRAVENOUS
  Filled 2020-07-04 (×7): qty 2

## 2020-07-04 MED ORDER — FENTANYL 2500MCG IN NS 250ML (10MCG/ML) PREMIX INFUSION
100.0000 ug/h | INTRAVENOUS | Status: DC
  Administered 2020-07-04: 100 ug/h via INTRAVENOUS
  Filled 2020-07-04: qty 250

## 2020-07-04 MED ORDER — LACTATED RINGERS IV BOLUS (SEPSIS)
1000.0000 mL | Freq: Once | INTRAVENOUS | Status: AC
  Administered 2020-07-04: 1000 mL via INTRAVENOUS

## 2020-07-04 MED ORDER — NOREPINEPHRINE 4 MG/250ML-% IV SOLN
0.0000 ug/min | INTRAVENOUS | Status: DC
  Administered 2020-07-04: 5 ug/min via INTRAVENOUS
  Administered 2020-07-04: 20 ug/min via INTRAVENOUS
  Filled 2020-07-04 (×2): qty 250

## 2020-07-04 MED ORDER — PROPOFOL 1000 MG/100ML IV EMUL
0.0000 ug/kg/min | INTRAVENOUS | Status: DC
  Administered 2020-07-04: 5 ug/kg/min via INTRAVENOUS
  Administered 2020-07-05: 10 ug/kg/min via INTRAVENOUS
  Filled 2020-07-04 (×2): qty 100

## 2020-07-04 MED ORDER — SODIUM CHLORIDE 0.9% FLUSH
3.0000 mL | Freq: Two times a day (BID) | INTRAVENOUS | Status: DC
  Administered 2020-07-05 – 2020-07-06 (×2): 3 mL via INTRAVENOUS

## 2020-07-04 MED ORDER — INSULIN ASPART 100 UNIT/ML ~~LOC~~ SOLN
0.0000 [IU] | SUBCUTANEOUS | Status: DC
  Administered 2020-07-05 (×2): 3 [IU] via SUBCUTANEOUS
  Filled 2020-07-04 (×2): qty 1

## 2020-07-04 MED ORDER — FENTANYL CITRATE (PF) 100 MCG/2ML IJ SOLN
25.0000 ug | Freq: Once | INTRAMUSCULAR | Status: DC
Start: 2020-07-04 — End: 2020-07-06

## 2020-07-04 MED ORDER — ZINC SULFATE 220 (50 ZN) MG PO CAPS
220.0000 mg | ORAL_CAPSULE | Freq: Every day | ORAL | Status: DC
  Administered 2020-07-05: 220 mg
  Filled 2020-07-04: qty 1

## 2020-07-04 MED ORDER — FENTANYL CITRATE (PF) 100 MCG/2ML IJ SOLN
100.0000 ug | Freq: Once | INTRAMUSCULAR | Status: AC
  Administered 2020-07-04: 100 ug via INTRAVENOUS

## 2020-07-04 MED ORDER — DEXAMETHASONE SODIUM PHOSPHATE 10 MG/ML IJ SOLN
6.0000 mg | Freq: Once | INTRAMUSCULAR | Status: AC
  Administered 2020-07-04: 6 mg via INTRAVENOUS
  Filled 2020-07-04: qty 1

## 2020-07-04 MED ORDER — SODIUM CHLORIDE 0.9 % IV BOLUS
1000.0000 mL | Freq: Once | INTRAVENOUS | Status: AC
  Administered 2020-07-04: 1000 mL via INTRAVENOUS

## 2020-07-04 MED ORDER — VASOPRESSIN 20 UNIT/ML IV SOLN: 0.4000 [IU]/min | INTRAVENOUS | Status: DC

## 2020-07-04 MED ORDER — ACETAMINOPHEN 325 MG PO TABS: 650.0000 mg | ORAL_TABLET | ORAL | Status: DC | PRN

## 2020-07-04 MED ORDER — SODIUM CHLORIDE 0.9% FLUSH: 3.0000 mL | INTRAVENOUS | Status: DC | PRN

## 2020-07-04 MED ORDER — PHENYLEPHRINE CONCENTRATED 100MG/250ML (0.4 MG/ML) INFUSION SIMPLE
0.0000 ug/min | INTRAVENOUS | Status: DC
  Filled 2020-07-04: qty 250

## 2020-07-04 MED ORDER — NOREPINEPHRINE 16 MG/250ML-% IV SOLN
0.0000 ug/min | INTRAVENOUS | Status: DC
  Administered 2020-07-05 (×3): 40 ug/min via INTRAVENOUS
  Administered 2020-07-06: 24 ug/min via INTRAVENOUS
  Filled 2020-07-04 (×6): qty 250

## 2020-07-04 MED ORDER — ASCORBIC ACID 500 MG PO TABS
500.0000 mg | ORAL_TABLET | Freq: Every day | ORAL | Status: DC
  Administered 2020-07-05: 500 mg
  Filled 2020-07-04: qty 1

## 2020-07-04 MED ORDER — FAMOTIDINE IN NACL 20-0.9 MG/50ML-% IV SOLN
20.0000 mg | Freq: Two times a day (BID) | INTRAVENOUS | Status: DC
  Administered 2020-07-05 (×2): 20 mg via INTRAVENOUS
  Filled 2020-07-04 (×3): qty 50

## 2020-07-04 MED ORDER — PROPOFOL 1000 MG/100ML IV EMUL
5.0000 ug/kg/min | INTRAVENOUS | Status: DC
  Administered 2020-07-04: 5 ug/kg/min via INTRAVENOUS
  Filled 2020-07-04: qty 100

## 2020-07-04 MED ORDER — SODIUM CHLORIDE 0.9 % IV SOLN
2.0000 g | INTRAVENOUS | Status: DC
  Administered 2020-07-04: 2 g via INTRAVENOUS
  Filled 2020-07-04: qty 20

## 2020-07-04 MED ORDER — SODIUM BICARBONATE 8.4 % IV SOLN: 50.0000 meq | Freq: Once | INTRAVENOUS | Status: AC

## 2020-07-04 MED ORDER — FAMOTIDINE IN NACL 20-0.9 MG/50ML-% IV SOLN: 20.0000 mg | Freq: Two times a day (BID) | INTRAVENOUS | Status: DC

## 2020-07-04 MED ORDER — SODIUM CHLORIDE 0.9 % IV SOLN
250.0000 mL | INTRAVENOUS | Status: DC | PRN
  Administered 2020-07-05: 250 mL via INTRAVENOUS

## 2020-07-04 MED ORDER — HEPARIN SODIUM (PORCINE) 5000 UNIT/ML IJ SOLN: 5000.0000 [IU] | Freq: Three times a day (TID) | INTRAMUSCULAR | Status: DC

## 2020-07-04 MED ORDER — DOCUSATE SODIUM 100 MG PO CAPS: 100.0000 mg | ORAL_CAPSULE | Freq: Two times a day (BID) | ORAL | Status: DC | PRN

## 2020-07-04 MED ORDER — HEPARIN SODIUM (PORCINE) 5000 UNIT/ML IJ SOLN
4000.0000 [IU] | Freq: Once | INTRAMUSCULAR | Status: AC
  Administered 2020-07-04: 4000 [IU] via INTRAVENOUS

## 2020-07-04 MED ORDER — FENTANYL BOLUS VIA INFUSION
25.0000 ug | INTRAVENOUS | Status: DC | PRN
Start: 2020-07-04 — End: 2020-07-06
  Filled 2020-07-04: qty 25

## 2020-07-04 MED ORDER — VASOPRESSIN 20 UNITS/100 ML INFUSION FOR SHOCK
0.0000 [IU]/min | INTRAVENOUS | Status: DC
  Administered 2020-07-04: 0.03 [IU]/min via INTRAVENOUS
  Administered 2020-07-05 – 2020-07-06 (×3): 0.04 [IU]/min via INTRAVENOUS
  Filled 2020-07-04 (×7): qty 100

## 2020-07-04 MED ORDER — SODIUM CHLORIDE 0.9 % IV SOLN
500.0000 mg | INTRAVENOUS | Status: DC
  Administered 2020-07-04: 500 mg via INTRAVENOUS
  Filled 2020-07-04: qty 500

## 2020-07-04 MED ORDER — DOCUSATE SODIUM 50 MG/5ML PO LIQD
100.0000 mg | Freq: Two times a day (BID) | ORAL | Status: DC
  Filled 2020-07-04 (×3): qty 10

## 2020-07-04 MED ORDER — LACTATED RINGERS IV SOLN: INTRAVENOUS | Status: DC

## 2020-07-04 MED ORDER — ONDANSETRON HCL 4 MG/2ML IJ SOLN: 4.0000 mg | Freq: Four times a day (QID) | INTRAMUSCULAR | Status: DC | PRN

## 2020-07-04 MED ORDER — POLYETHYLENE GLYCOL 3350 17 G PO PACK: 17.0000 g | PACK | Freq: Every day | ORAL | Status: DC

## 2020-07-04 MED ORDER — ETOMIDATE 2 MG/ML IV SOLN
10.0000 mg | Freq: Once | INTRAVENOUS | Status: AC
  Administered 2020-07-04: 10 mg via INTRAVENOUS

## 2020-07-04 MED ORDER — FENTANYL 2500MCG IN NS 250ML (10MCG/ML) PREMIX INFUSION
25.0000 ug/h | INTRAVENOUS | Status: DC
  Administered 2020-07-04: 100 ug/h via INTRAVENOUS
  Administered 2020-07-05: 125 ug/h via INTRAVENOUS
  Administered 2020-07-06: 175 ug/h via INTRAVENOUS
  Filled 2020-07-04 (×2): qty 250

## 2020-07-04 MED ORDER — POLYETHYLENE GLYCOL 3350 17 G PO PACK: 17.0000 g | PACK | Freq: Every day | ORAL | Status: DC | PRN

## 2020-07-04 MED ORDER — LACTATED RINGERS IV BOLUS
1000.0000 mL | Freq: Once | INTRAVENOUS | Status: AC
  Administered 2020-07-04: 1000 mL via INTRAVENOUS

## 2020-07-04 MED ORDER — HEPARIN (PORCINE) 25000 UT/250ML-% IV SOLN
1200.0000 [IU]/h | INTRAVENOUS | Status: DC
  Administered 2020-07-04: 900 [IU]/h via INTRAVENOUS
  Filled 2020-07-04 (×2): qty 250

## 2020-07-04 MED ORDER — ROCURONIUM BROMIDE 50 MG/5ML IV SOLN
100.0000 mg | Freq: Once | INTRAVENOUS | Status: AC
  Administered 2020-07-04: 100 mg via INTRAVENOUS
  Filled 2020-07-04: qty 10

## 2020-07-04 NOTE — Progress Notes (Signed)
Notified bedside nurse of need to draw lactic acid around 1400 (#3).

## 2020-07-04 NOTE — ED Provider Notes (Signed)
[facility] Emergency Department Provider Note ____________________________________________   Event Date/Time   First MD Initiated Contact with Patient 07/18/2020 1036     (approximate)  I have reviewed the triage vital signs and the nursing notes.  HISTORY  Chief Complaint Respiratory Distress   HPI Derek Townsend is a 65 y.o. malewho presents to the ED for evaluation of respiratory distress.   Chart review indicates hx DM, HTN, Gout.   Patient reportedly tested positive COVID-19 10 days ago as an outpatient, and has been managed at home until today.  Patient presents to the ED via EMS for respiratory distress and shortness of breath.  They report finding the patient at 50% oxygen saturations on room air, improving with nonrebreather.  Patient reporting shortness of breath, thirst and requesting water.  Denies syncopal episodes, emesis or abdominal pain.  History somewhat limited due to acuity of condition and his respiratory status.   Past Medical History:  Diagnosis Date  . Diabetes mellitus without complication (HCC)    diet controlled  . Gout   . Hypertension     Patient Active Problem List   Diagnosis Date Noted  . Osteonecrosis of right hip (HCC) 11/03/2015    Past Surgical History:  Procedure Laterality Date  . JOINT REPLACEMENT Left October 2014   Dr. Rosita Kea, [facility]  . TOTAL HIP ARTHROPLASTY Right 11/03/2015   Procedure: TOTAL HIP ARTHROPLASTY ANTERIOR APPROACH;  Surgeon: Kennedy Bucker, MD;  Location: [facility] ORS;  Service: Orthopedics;  Laterality: Right;    Prior to Admission medications   Medication Sig Start Date End Date Taking? Authorizing Provider  acetaminophen (TYLENOL) 325 MG tablet Take 650 mg by mouth every 6 (six) hours as needed.    [provider]  allopurinol (ZYLOPRIM) 100 MG tablet Take 200 mg by mouth daily.    [provider]  amLODipine (NORVASC) 10 MG tablet Take 10 mg by mouth daily.    [provider]  enoxaparin (LOVENOX) 40 MG/0.4ML injection Inject 0.4 mLs (40 mg total) into the skin daily. X 14 days 11/05/15 11/19/15  Evon Slack, PA-C  meloxicam (MOBIC) 7.5 MG tablet Take 7.5 mg by mouth daily.    [provider]  ondansetron (ZOFRAN) 4 MG tablet Take 1 tablet (4 mg total) by mouth every 6 (six) hours as needed for nausea. 11/05/15   Evon Slack, PA-C  oxyCODONE (OXY IR/ROXICODONE) 5 MG immediate release tablet Take 1-2 tablets (5-10 mg total) by mouth every 3 (three) hours as needed for breakthrough pain. 11/05/15   Evon Slack, PA-C  pravastatin (PRAVACHOL) 40 MG tablet Take 40 mg by mouth at bedtime.    [provider]    Allergies Patient has no known allergies.  History reviewed. No pertinent family history.  Social History Social History   Tobacco Use  . Smoking status: Former Smoker    Packs/day: 2.00    Types: Cigarettes    Quit date: 11/24/1979    Years since quitting: 40.6  . Smokeless tobacco: Never Used  Substance Use Topics  . Alcohol use: No  . Drug use: No    Review of Systems  Unable to be accurately assessed due to patient's respiratory distress and acuity of condition.  ____________________________________________   PHYSICAL EXAM:  VITAL SIGNS: Vitals:   07/24/2020 1037  BP: 134/87  Pulse: (!) 123  Resp: (!) 26  Temp: 98 F (36.7 C)  SpO2: (!) 88%      Constitutional: Alert.  Sitting up in bed, tripoding with pursed lip breathing.  Tachypneic to the 40s. Eyes: Conjunctivae are normal. PERRL. EOMI. Head: Atraumatic. Nose: No congestion/rhinnorhea. Mouth/Throat: Mucous membranes are dry.  Oropharynx non-erythematous. Neck: No stridor. No cervical spine tenderness to palpation. Cardiovascular: Tachycardic rate, regular rhythm. Grossly normal heart sounds.  Good peripheral circulation. Respiratory: Obvious respiratory distress, tachypneic to the 40s with pursed lip breathing.  Good airflow  throughout.  No wheezes. Gastrointestinal: Soft , nondistended Musculoskeletal: No lower extremity tenderness nor edema.  No joint effusions. No signs of acute trauma. Neurologic:  Normal speech and language. No gross focal neurologic deficits are appreciated.  Skin:  Skin is dry and intact. No rash noted. Psychiatric: Mood and affect are normal. Speech and behavior are normal.  ____________________________________________   LABS (all labs ordered are listed, but only abnormal results are displayed)  Labs Reviewed  MAGNESIUM - Abnormal; Notable for the following components:      Result Value   Magnesium 2.8 (*)    All other components within normal limits  CBC WITH DIFFERENTIAL/PLATELET - Abnormal; Notable for the following components:   WBC 21.5 (*)    Hemoglobin 12.7 (*)    HCT 37.5 (*)    nRBC 0.3 (*)    Neutro Abs 17.0 (*)    Abs Immature Granulocytes 1.76 (*)    All other components within normal limits  COMPREHENSIVE METABOLIC PANEL - Abnormal; Notable for the following components:   Sodium 129 (*)    Chloride 90 (*)    CO2 13 (*)    Glucose, Bld 173 (*)    Creatinine, Ser 1.51 (*)    Calcium 8.4 (*)    Albumin 2.7 (*)    AST 480 (*)    ALT 317 (*)    Total Bilirubin 1.9 (*)    GFR, Estimated 51 (*)    Anion gap 26 (*)    All other components within normal limits  BRAIN NATRIURETIC PEPTIDE - Abnormal; Notable for the following components:   B Natriuretic Peptide 240.2 (*)    All other components within normal limits  LACTIC ACID, PLASMA - Abnormal; Notable for the following components:   Lactic Acid, Venous >11.0 (*)    All other components within normal limits  BLOOD GAS, VENOUS - Abnormal; Notable for the following components:   pH, Ven 7.16 (*)    pCO2, Ven 40 (*)    Bicarbonate 14.3 (*)    Acid-base deficit 13.7 (*)    All other components within normal limits  LACTIC ACID, PLASMA - Abnormal; Notable for the following components:   Lactic Acid, Venous  >11.0 (*)    All other components within normal limits  APTT - Abnormal; Notable for the following components:   aPTT 48 (*)    All other components within normal limits  PROTIME-INR - Abnormal; Notable for the following components:   Prothrombin Time 23.4 (*)    INR 2.2 (*)    All other components within normal limits  BLOOD GAS, ARTERIAL - Abnormal; Notable for the following components:   pH, Arterial 7.06 (*)    pCO2 arterial 60 (*)    pO2, Arterial 53 (*)    Bicarbonate 17.0 (*)    Acid-base deficit 13.5 (*)    All other components within normal limits  POC SARS CORONAVIRUS 2 AG -  ED - Abnormal; Notable for the following components:   SARS Coronavirus 2 Ag Positive (*)    All other components within normal limits  TROPONIN I (HIGH SENSITIVITY) - Abnormal; Notable for the following components:   Troponin I (High Sensitivity) 606 (*)    All other components within normal limits  TROPONIN I (HIGH SENSITIVITY) - Abnormal; Notable for the following components:   Troponin I (High Sensitivity) 614 (*)    All other components within normal limits  CULTURE, BLOOD (SINGLE)  HEPARIN LEVEL (UNFRACTIONATED)   ____________________________________________  12 Lead EKG  Sinus rhythm, Rate of 109 bpm.  Normal axis and intervals.  No evidence of acute ischemia. ____________________________________________  RADIOLOGY  ED MD interpretation: 1 view CXR reviewed by me with dense patchy multifocal infiltrates consistent with COVID-19 without discrete lobar filtration  1 view CXR reviewed by me immediately after intubation with ETT at the carina.  ET tube withdrawn 2 cm after this CXR was taken.  Official radiology report(s): DG Chest 1 View  Result Date: 2020/09/18 CLINICAL DATA:  Respiratory distress, positive COVID test 10 days ago, post intubation. EXAM: CHEST  1 VIEW COMPARISON:  Earlier the same day at 1052 hours. FINDINGS: Endotracheal tube terminates 1.4 cm above the carina. Heart  size stable. Worsening patchy bilateral airspace opacification with low lung volumes. No definite pleural fluid. IMPRESSION: 1. Endotracheal tube is low lying. Retracting 3-4 cm would better position the tip above the carina. 2. Worsening bilateral airspace opacification, indicative of COVID-19 pneumonia. Electronically Signed   By: Leanna BattlesMelinda  Blietz M.D.   On: 02022/03/27 13:15   DG Chest Portable 1 View  Result Date: 2020/09/18 CLINICAL DATA:  Respiratory distress.  Hypoxia.  COVID pneumonia. EXAM: PORTABLE CHEST 1 VIEW COMPARISON:  None. FINDINGS: Mildly low lung volumes with indistinct pulmonary vasculature and hazy interstitial and patchy airspace opacities in both lungs. Borderline enlargement of the cardiopericardial silhouette. No blunting of the costophrenic angles. Thoracic spondylosis. Prominent upper mediastinum is nonspecific and may be related to portable AP technique. IMPRESSION: 1. Bilateral interstitial and patchy airspace opacities in both lungs, compatible with atypical pneumonia or edema. 2. Borderline enlargement of the cardiopericardial silhouette. 3. Thoracic spondylosis. Electronically Signed   By: Gaylyn RongWalter  Liebkemann M.D.   On: 02022/03/27 11:14    ____________________________________________   PROCEDURES and INTERVENTIONS  Procedure(s) performed (including Critical Care):  .1-3 Lead EKG Interpretation Performed by: Delton PrairieSmith, Cortnee Steinmiller, MD Authorized by: Delton PrairieSmith, Ramina Hulet, MD     Interpretation: abnormal     ECG rate:  110   ECG rate assessment: tachycardic     Rhythm: sinus tachycardia     Ectopy: none     Conduction: normal   .Critical Care Performed by: Delton PrairieSmith, Andru Genter, MD Authorized by: Delton PrairieSmith, Maanasa Aderhold, MD   Critical care provider statement:    Critical care time (minutes):  75   Critical care was necessary to treat or prevent imminent or life-threatening deterioration of the following conditions:  Respiratory failure   Critical care was time spent personally by me on the  following activities:  Discussions with consultants, evaluation of patient's response to treatment, examination of patient, ordering and performing treatments and interventions, ordering and review of laboratory studies, ordering and review of radiographic studies, pulse oximetry, re-evaluation of patient's condition, obtaining history from patient or surrogate and review of old charts Procedure Name: Intubation Date/Time: 2020/09/18 1:33 PM Performed by: Delton PrairieSmith, Trevelle Mcgurn, MD Pre-anesthesia Checklist: Patient identified, Patient being monitored, Emergency Drugs available, Timeout performed and Suction available Oxygen Delivery Method: Non-rebreather mask Preoxygenation: Pre-oxygenation with 100% oxygen Induction Type: Rapid sequence Ventilation: Mask ventilation without difficulty Laryngoscope Size: Glidescope and 3 Grade View: Grade I  Tube size: 7.5 mm Number of attempts: 1 Airway Equipment and Method: Rigid stylet Placement Confirmation: ETT inserted through vocal cords under direct vision,  CO2 detector and Breath sounds checked- equal and bilateral Secured at: 22 cm Tube secured with: ETT holder       Medications  lactated ringers infusion ( Intravenous New Bag/Given July 12, 2020 1220)  cefTRIAXone (ROCEPHIN) 2 g in sodium chloride 0.9 % 100 mL IVPB (0 g Intravenous Stopped 07-12-20 1216)  azithromycin (ZITHROMAX) 500 mg in sodium chloride 0.9 % 250 mL IVPB (0 mg Intravenous Stopped 2020/07/12 1245)  heparin injection 4,000 Units (has no administration in time range)  heparin ADULT infusion 100 units/mL (25000 units/244mL) (900 Units/hr Intravenous New Bag/Given 2020/07/12 1315)  fentaNYL in NS (31mcg/ml) infusion-PREMIX (100 mcg/hr Intravenous New Bag/Given 07/12/2020 1304)  propofol (DIPRIVAN) 1000 MG/100ML infusion (has no administration in time range)  etomidate (AMIDATE) injection 10 mg (has no administration in time range)  fentaNYL (SUBLIMAZE) injection 100 mcg (has no  administration in time range)  dexamethasone (DECADRON) injection 6 mg (6 mg Intravenous Given 07-12-2020 1057)  lactated ringers bolus 1,000 mL (0 mLs Intravenous Stopped 07-12-20 1216)  lactated ringers bolus 1,000 mL (0 mLs Intravenous Stopped 07-12-2020 1216)  rocuronium (ZEMURON) injection 100 mg (100 mg Intravenous Given Jul 12, 2020 1248)  etomidate (AMIDATE) injection 10 mg (10 mg Intravenous Given 07-12-20 1247)    ____________________________________________   MDM / ED COURSE   65 year old male with COVID-19 presents to the ED in respiratory distress with associated NSTEMI, failing BiPAP therapy and requiring endotracheal intubation for ICU admission.  Patient persistently tachycardic in a sinus tachycardia, but hemodynamically stable.  Quite hypoxic necessitating nonrebreather, then BiPAP, but ultimately failing BiPAP therapy due to worsening tachypnea and severely deranged metabolic disorder.  Lactic acid greater than 11, elevated troponin suggestive of NSTEMI.  EKG without STEMI, heparin started to treat NSTEMI.  Blood cultures were drawn and antibiotics provided, per protocol.  RUQ ultrasound ordered due to blood work with biliary dysfunction, but still pending the time of ICU admission.   Clinical Course as of Jul 12, 2020 1329  Mon Jul 12, 2020  1103 Elevated WBC count noted.  We will initiate sepsis protocols [DS]  1111 Reassessed.  Patient settling out on BiPAP.  Slowing his respiratory rate in the mid 30s.  Looks slightly improved. [DS]  1214 RN informs me of elevated trop to about 600. Has not crossed over yet. I reassessed the patient and he continues to deny chest pain at this time.  Now little bit more tachypneic, similar presentation, to the 40s.  Educated nurse that we will pull PTT and start a heparin drip [DS]  1232 I speak with Dr. Belia Heman, discuss presentation and work-up.  He recommends endotracheal intubation for ICU admission.  I agree to this [DS]  1234 I called the number listed  for patient's spouse, Larita Fife, Rings for about 60 seconds without voicemail or answer. [DS]  1318 Intubated with 100 mics of fentanyl, 10 mg of etomidate and 100 mg of rocuronium.  Additional 10 mg of etomidate after intubation due to persistent sinus tachycardia in the 130s.  First CXR with ETT tip at the carina, so backed out 2 cm after the image was taken. [DS]  1318 Prior to intubation, I discussed the need for ventilator management with the patient.  I also let him know that I was unable to speak with his wife, [DS]  1319  he does not provide me with additional number when  I ask and does not provide an additional phone number for Larita Fife, indicating that he does not want me to talk with her [DS]  1328 Repeat ABG worsening.  I asked RT use increase PEEP to 12. [DS]    Clinical Course User Index [DS] Delton Prairie, MD    ____________________________________________   FINAL CLINICAL IMPRESSION(S) / ED DIAGNOSES  Final diagnoses:  COVID-19  NSTEMI (non-ST elevated myocardial infarction) [facility])  Respiratory distress  Hypoxia  Bilirubinemia     ED Discharge Orders    None       Lita Flynn   Note:  This document was prepared using Dragon voice recognition software and may include unintentional dictation errors.   Delton Prairie, MD 07/03/2020 1335

## 2020-07-04 NOTE — Progress Notes (Signed)
Notified bedside nurse of need to administer fluid bolus. Pt needs a total of 2490 cc

## 2020-07-04 NOTE — Procedures (Signed)
Central Venous Catheter Insertion Procedure Note  SADE MEHLHOFF  [id number]  02-28-1956  Date:07/03/2020  Time:11:34 PM   Provider Performing:Dreydon Cardenas D Elvina Sidle   Procedure: Insertion of Non-tunneled Central Venous Catheter(36556) with US guidance (02725)   Indication(s) Medication administration and Difficult access  Consent Unable to obtain consent due to emergent nature of procedure.  Anesthesia Topical only with 1% lidocaine   Timeout Verified patient identification, verified procedure, site/side was marked, verified correct patient position, special equipment/implants available, medications/allergies/relevant history reviewed, required imaging and test results available.  Sterile Technique Maximal sterile technique including full sterile barrier drape, hand hygiene, sterile gown, sterile gloves, mask, hair covering, sterile ultrasound probe cover (if used).  Procedure Description Area of catheter insertion was cleaned with chlorhexidine and draped in sterile fashion.  With real-time ultrasound guidance a central venous catheter was placed into the right internal jugular vein. Nonpulsatile blood flow and easy flushing noted in all ports.  The catheter was sutured in place and sterile dressing applied.  Complications/Tolerance None; patient tolerated the procedure well. Chest X-ray is ordered to verify placement for internal jugular or subclavian cannulation.   Chest x-ray is not ordered for femoral cannulation.  EBL Minimal  Specimen(s) None   Line secured at the 16 cm mark.  BIOPATCH applied to the insertion site.   Harlon Ditty, AGACNP-BC [facility] Pager: [phone]

## 2020-07-04 NOTE — Progress Notes (Signed)
CODE SEPSIS - PHARMACY COMMUNICATION  **Broad Spectrum Antibiotics should be administered within 1 hour of Sepsis diagnosis**  Time Code Sepsis Called/Page Received: 1105  Antibiotics Ordered: Ceftriaxone + azithromycin  Time of 1st antibiotic administration: 1128  Additional action taken by pharmacy: N/A  Derek Townsend 07/10/2020  11:04 AM

## 2020-07-04 NOTE — Progress Notes (Signed)
Notified provider of need to order repeat lactic acid @ 1400. 

## 2020-07-04 NOTE — Progress Notes (Signed)
Notified bedside nurse of need to draw repeat lactic acid @ 1241, following for code sepsis.

## 2020-07-04 NOTE — ED Triage Notes (Signed)
Pt presents from home via [facility] with c/o respiratory distress. positive covid swab 10 days ago. 50% on room air when fire department arrived. Pt respiratrions labored at this time. Blood sugar 130. Pt oxygen saturation 65% on 10L upon arrival. Respiratory called to bedside for bi-pap. MD Katrinka Blazing at bedside.

## 2020-07-04 NOTE — H&P (Signed)
Name: Derek Townsend MRN: [id number] DOB: 1956-02-11     CONSULTATION DATE: 07/25/2020  REFERRING MD :  Katrinka Blazing  CHIEF COMPLAINT:  COVID 19 pnuemonia HISTORY OF PRESENT ILLNESS:  65 yo white male with severe hypoxia Dx with COVID 1 week ago Developed SOB and chest congestion acutely in last 24 hrs  Patient with severe hypoxia with confusion with b/l Infiltrates on CXR Patient emergently intubated and sedated   PAST MEDICAL HISTORY :   has a past medical history of Diabetes mellitus without complication (HCC), Gout, and Hypertension.  has a past surgical history that includes Joint replacement (Left, October 2014) and Total hip arthroplasty (Right, 11/03/2015). Prior to Admission medications   Medication Sig Start Date End Date Taking? Authorizing Provider  acetaminophen (TYLENOL) 325 MG tablet Take 650 mg by mouth every 6 (six) hours as needed.    [provider]  allopurinol (ZYLOPRIM) 100 MG tablet Take 200 mg by mouth daily.    [provider]  amLODipine (NORVASC) 10 MG tablet Take 10 mg by mouth daily.    [provider]  enoxaparin (LOVENOX) 40 MG/0.4ML injection Inject 0.4 mLs (40 mg total) into the skin daily. X 14 days 11/05/15 11/19/15  Evon Slack, PA-C  meloxicam (MOBIC) 7.5 MG tablet Take 7.5 mg by mouth daily.    [provider]  ondansetron (ZOFRAN) 4 MG tablet Take 1 tablet (4 mg total) by mouth every 6 (six) hours as needed for nausea. 11/05/15   Evon Slack, PA-C  oxyCODONE (OXY IR/ROXICODONE) 5 MG immediate release tablet Take 1-2 tablets (5-10 mg total) by mouth every 3 (three) hours as needed for breakthrough pain. 11/05/15   Evon Slack, PA-C  pravastatin (PRAVACHOL) 40 MG tablet Take 40 mg by mouth at bedtime.    [provider]   No Known Allergies  FAMILY HISTORY:  family history is not on file. SOCIAL HISTORY:  reports that he quit smoking about 40 years ago. His smoking use included cigarettes. He  smoked 2.00 packs per day. He has never used smokeless tobacco. He reports that he does not drink alcohol and does not use drugs.  REVIEW OF SYSTEMS:   Unable to obtain due to critical illness      Estimated body mass index is 30.45 kg/m as calculated from the following:   Height as of this encounter: 5\' 5"  (1.651 m).   Weight as of this encounter: 83 kg.    VITAL SIGNS: Temp:  [97.4 F (36.3 C)-100.1 F (37.8 C)] 99.9 F (37.7 C) (01/10 1512) Pulse Rate:  [101-135] 103 (01/10 1512) Resp:  [21-26] 23 (01/10 1512) BP: (96-134)/(44-91) 110/51 (01/10 1510) SpO2:  [71 %-89 %] 88 % (01/10 1510) FiO2 (%):  [100 %] 100 % (01/10 1250) Weight:  [83 kg] 83 kg (01/10 1038)   No intake/output data recorded. No intake/output data recorded.   SpO2: (!) 88 % FiO2 (%): 100 %   Physical Examination:  GENERAL:critically ill appearing, +resp distress HEAD: Normocephalic, atraumatic.  EYES: Pupils equal, round, reactive to light.  No scleral icterus.  MOUTH: Moist mucosal membrane. NECK: Supple. No JVD.  PULMONARY: +rhonchi, +wheezing CARDIOVASCULAR: S1 and S2. Regular rate and rhythm. No murmurs, rubs, or gallops.  GASTROINTESTINAL: Soft, nontender, -distended.  Positive bowel sounds.  MUSCULOSKELETAL: No swelling, clubbing, or edema.  NEUROLOGIC: obtunded SKIN:intact,warm,dry  I personally reviewed lab work that was obtained in last 24 hrs. CXR Independently reviewed-b/l interstitial infiltrates  MEDICATIONS: I have reviewed  all medications and confirmed regimen as documented   CULTURE RESULTS   No results found for this or any previous visit (from the past 240 hour(s)).        IMAGING    DG Chest 1 View  Result Date: 07/14/2020 CLINICAL DATA:  Respiratory distress, positive COVID test 10 days ago, post intubation. EXAM: CHEST  1 VIEW COMPARISON:  Earlier the same day at 1052 hours. FINDINGS: Endotracheal tube terminates 1.4 cm above the carina. Heart size stable.  Worsening patchy bilateral airspace opacification with low lung volumes. No definite pleural fluid. IMPRESSION: 1. Endotracheal tube is low lying. Retracting 3-4 cm would better position the tip above the carina. 2. Worsening bilateral airspace opacification, indicative of COVID-19 pneumonia. Electronically Signed   By: Leanna Battles M.D.   On: 07/21/2020 13:15   DG Abdomen 1 View  Result Date: 07/01/2020 CLINICAL DATA:  Nasogastric tube placement. EXAM: ABDOMEN - 1 VIEW COMPARISON:  None. FINDINGS: The bowel gas pattern is normal. Distal tip of nasogastric tube is seen in the proximal stomach. No radio-opaque calculi or other significant radiographic abnormality are seen. IMPRESSION: Distal tip of nasogastric tube seen in proximal stomach. Electronically Signed   By: Lupita Raider M.D.   On: 07/22/2020 14:06   DG Chest Portable 1 View  Result Date: 07/22/2020 CLINICAL DATA:  Respiratory distress.  Hypoxia.  COVID pneumonia. EXAM: PORTABLE CHEST 1 VIEW COMPARISON:  None. FINDINGS: Mildly low lung volumes with indistinct pulmonary vasculature and hazy interstitial and patchy airspace opacities in both lungs. Borderline enlargement of the cardiopericardial silhouette. No blunting of the costophrenic angles. Thoracic spondylosis. Prominent upper mediastinum is nonspecific and may be related to portable AP technique. IMPRESSION: 1. Bilateral interstitial and patchy airspace opacities in both lungs, compatible with atypical pneumonia or edema. 2. Borderline enlargement of the cardiopericardial silhouette. 3. Thoracic spondylosis. Electronically Signed   By: Gaylyn Rong M.D.   On: 06/27/2020 11:14     Nutrition Status:           Indwelling Urinary Catheter continued, requirement due to   Reason to continue Indwelling Urinary Catheter strict Intake/Output monitoring for hemodynamic instability         Ventilator continued, requirement due to severe respiratory failure   Ventilator  Sedation RASS 0 to -2      ASSESSMENT AND PLAN SYNOPSIS  65 yo obese white male with severe ARDS with severe and acute hypoxic resp failure due to COVID 19 pneumonia causing NSTEMI and acute renal failure with acidosis  Severe ACUTE Hypoxic and Hypercapnic Respiratory Failure -continue Full MV support -continue Bronchodilator Therapy -Wean Fio2 and PEEP as tolerated -VAP/VENT bundle implementation   Acute hypoxemic respiratory failure due to COVID-19 pneumonia SEVERE ARDS Mechanical ventilation via ARDS protocol, target PRVC 6 cc/kg Wean PEEP and FiO2 as able Goal plateau pressure less than 30, driving pressure less than 15 Paralytics if necessary for vent synchrony, gas exchange Cycle prone positioning if necessary for oxygenation Deep sedation per PAD protocol, goal RASS -4,  Diuresis as blood pressure and renal function can tolerate, goal CVP 5-8.   diuresis as tolerated based on Kidney function VAP prevention order set Remdesivir IV steroids   Morbid obesity, possible OSA.   Will certainly impact respiratory mechanics, ventilator weaning Suspect will need to consider additional PEEP   ACUTE KIDNEY INJURY/Renal Failure -continue Foley Catheter-assess need -Avoid nephrotoxic agents -Follow urine output, BMP -Ensure adequate renal perfusion, optimize oxygenation -Renal dose medications     NEUROLOGY  Acute toxic metabolic encephalopathy, need for sedation Goal RASS -2 to -3   CARDIAC ICU monitoring  ID -continue IV abx as prescibed -follow up cultures  GI GI PROPHYLAXIS as indicated  NUTRITIONAL STATUS DIET-->TF's as tolerated Constipation protocol as indicated   ENDO - will use ICU hypoglycemic\Hyperglycemia protocol if needed    ELECTROLYTES -follow labs as needed -replace as needed -pharmacy consultation and following    DVT/GI PRX ordered and assessed TRANSFUSIONS AS NEEDED MONITOR FSBS I Assessed the need for Labs I Assessed the  need for Foley I Assessed the need for Central Venous Line Family Discussion when available I Assessed the need for Mobilization I made an Assessment of medications to be adjusted accordingly Safety Risk assessment Completed  CASE DISCUSSED IN MULTIDISCIPLINARY ROUNDS WITH ICU TEAM   Critical Care Time devoted to patient care services described in this note is 65 minutes.   Overall, patient is critically ill, prognosis is guarded.  Patient with Multiorgan failure and at high risk for cardiac arrest and death.    Lucie Leather, M.D.  [facility]  Medical Director [facility] Medical Director [facility] Cardio-Pulmonary Department

## 2020-07-04 NOTE — Consult Note (Signed)
ANTICOAGULATION CONSULT NOTE  Pharmacy Consult for Heparin Infusion Indication: chest pain/ACS  Patient Measurements: Heparin Dosing Weight: 78.7 kg  Labs: Recent Labs    07/01/2020 1041  HGB 12.7*  HCT 37.5*  PLT 208  CREATININE 1.51*  TROPONINIHS 606*    Estimated Creatinine Clearance: 47.7 mL/min (A) (by C-G formula based on SCr of 1.51 mg/dL (H)).   Medical History: Past Medical History:  Diagnosis Date  . Diabetes mellitus without complication (HCC)    diet controlled  . Gout   . Hypertension     Medications:  No anticoagulation prior to admission per my chart review. Medication reconciliation is pending.  Assessment: Patient is a 65 y/o M with medical history as above who presented to the ED 1/10 with respiratory distress in setting of known SARS-CoV-2 positivity. Troponin elevated to 606. Pharmacy has been consulted to initiate heparin infusion for suspected ACS.  Baseline CBC with Hgb 12.7, platelets within normal limits. Baseline aPTT and PT-INR pending. May be elevated in the setting of liver injury given transaminitis.    Goal of Therapy:  Heparin level 0.3-0.7 units/ml Monitor platelets by anticoagulation protocol: Yes   Plan:  --Provider has ordered IV heparin 4000 unit bolus --Will order infusion at 900 units/hr to run continuously after bolus --Will check heparin level 6 hours after initiation of infusion --Daily CBC per protocol while on heparin infusion  Tressie Ellis 07/24/2020,12:26 PM

## 2020-07-04 NOTE — ED Notes (Signed)
 fentanyl given IV via verbal order MD Katrinka Blazing

## 2020-07-04 NOTE — ED Notes (Signed)
ET tube placed 7.18mm 21cm at gum line by MD Katrinka Blazing, positive color change

## 2020-07-05 ENCOUNTER — Inpatient Hospital Stay (HOSPITAL_COMMUNITY)
Admit: 2020-07-05 | Discharge: 2020-07-05 | Disposition: A | Discharge status: Home / Self Care | Payer: Medicare Other | Attending: Pulmonary Disease | Admitting: Pulmonary Disease

## 2020-07-05 ENCOUNTER — Inpatient Hospital Stay: Payer: Medicare Other

## 2020-07-05 DIAGNOSIS — R778 Other specified abnormalities of plasma proteins: Secondary | ICD-10-CM

## 2020-07-05 DIAGNOSIS — J80 Acute respiratory distress syndrome: Secondary | ICD-10-CM | POA: Diagnosis not present

## 2020-07-05 DIAGNOSIS — U071 COVID-19: Secondary | ICD-10-CM | POA: Diagnosis not present

## 2020-07-05 DIAGNOSIS — J9601 Acute respiratory failure with hypoxia: Secondary | ICD-10-CM | POA: Diagnosis not present

## 2020-07-05 DIAGNOSIS — R579 Shock, unspecified: Secondary | ICD-10-CM

## 2020-07-05 DIAGNOSIS — J9602 Acute respiratory failure with hypercapnia: Secondary | ICD-10-CM

## 2020-07-05 DIAGNOSIS — N179 Acute kidney failure, unspecified: Secondary | ICD-10-CM

## 2020-07-05 LAB — RENAL FUNCTION PANEL
Albumin: 2 g/dL — ABNORMAL LOW (ref 3.5–5.0)
Anion gap: 26 — ABNORMAL HIGH (ref 5–15)
BUN: 27 mg/dL — ABNORMAL HIGH (ref 8–23)
CO2: 23 mmol/L (ref 22–32)
Calcium: 5 mg/dL — CL (ref 8.9–10.3)
Chloride: 87 mmol/L — ABNORMAL LOW (ref 98–111)
Creatinine, Ser: 3.88 mg/dL — ABNORMAL HIGH (ref 0.61–1.24)
GFR, Estimated: 16 mL/min — ABNORMAL LOW (ref >60)
Glucose, Bld: 89 mg/dL (ref 70–99)
Phosphorus: 12.4 mg/dL — ABNORMAL HIGH (ref 2.5–4.6)
Potassium: 5.4 mmol/L — ABNORMAL HIGH (ref 3.5–5.1)
Sodium: 136 mmol/L (ref 135–145)

## 2020-07-05 LAB — D-DIMER, QUANTITATIVE: D-Dimer, Quant: >20 ug/mL-FEU — ABNORMAL HIGH (ref 0.00–0.50)

## 2020-07-05 LAB — ECHOCARDIOGRAM COMPLETE
AR max vel: 3.03 cm2
AV Area VTI: 3.35 cm2
AV Area mean vel: 2.84 cm2
AV Mean grad: 4 mmHg
AV Peak grad: 8.8 mmHg
Ao pk vel: 1.48 m/s
Area-P 1/2: 5.46 cm2
Height: 65 in
MV VTI: 3.37 cm2
S' Lateral: 2.18 cm
Weight: 2927.71 oz

## 2020-07-05 LAB — CBC
HCT: 37.4 % — ABNORMAL LOW (ref 39.0–52.0)
HCT: 35.5 % — ABNORMAL LOW (ref 39.0–52.0)
Hemoglobin: 12.3 g/dL — ABNORMAL LOW (ref 13.0–17.0)
Hemoglobin: 11.6 g/dL — ABNORMAL LOW (ref 13.0–17.0)
MCH: 28.3 pg (ref 26.0–34.0)
MCH: 28 pg (ref 26.0–34.0)
MCHC: 32.9 g/dL (ref 30.0–36.0)
MCHC: 32.7 g/dL (ref 30.0–36.0)
MCV: 86 fL (ref 80.0–100.0)
MCV: 85.5 fL (ref 80.0–100.0)
Platelets: 59 10*3/uL — ABNORMAL LOW (ref 150–400)
Platelets: 66 10*3/uL — ABNORMAL LOW (ref 150–400)
RBC: 4.35 MIL/uL (ref 4.22–5.81)
RBC: 4.15 MIL/uL — ABNORMAL LOW (ref 4.22–5.81)
RDW: 16.8 % — ABNORMAL HIGH (ref 11.5–15.5)
RDW: 16.6 % — ABNORMAL HIGH (ref 11.5–15.5)
WBC: 36.7 10*3/uL — ABNORMAL HIGH (ref 4.0–10.5)
WBC: 37.1 10*3/uL — ABNORMAL HIGH (ref 4.0–10.5)
nRBC: 1.5 % — ABNORMAL HIGH (ref 0.0–0.2)
nRBC: 1.7 % — ABNORMAL HIGH (ref 0.0–0.2)

## 2020-07-05 LAB — BLOOD GAS, ARTERIAL
Acid-base deficit: 10.2 mmol/L — ABNORMAL HIGH (ref 0.0–2.0)
Acid-base deficit: 20.5 mmol/L — ABNORMAL HIGH (ref 0.0–2.0)
Bicarbonate: 12.3 mmol/L — ABNORMAL LOW (ref 20.0–28.0)
Bicarbonate: 20.5 mmol/L (ref 20.0–28.0)
FIO2: 1
FIO2: 1
MECHVT: 480 mL
MECHVT: 480 mL
Mechanical Rate: 20
O2 Saturation: 77.4 %
O2 Saturation: 85.2 %
PEEP: 12 cmH2O
PEEP: 15 cmH2O
Patient temperature: 37
Patient temperature: 37
RATE: 24 resp/min
pCO2 arterial: 60 mmHg — ABNORMAL HIGH (ref 32.0–48.0)
pCO2 arterial: 69 mmHg (ref 32.0–48.0)
pH, Arterial: 6.92 — CL (ref 7.350–7.450)
pH, Arterial: 7.08 — CL (ref 7.350–7.450)
pO2, Arterial: 70 mmHg — ABNORMAL LOW (ref 83.0–108.0)
pO2, Arterial: 70 mmHg — ABNORMAL LOW (ref 83.0–108.0)

## 2020-07-05 LAB — BASIC METABOLIC PANEL
Anion gap: 20 — ABNORMAL HIGH (ref 5–15)
BUN: 21 mg/dL (ref 8–23)
CO2: 15 mmol/L — ABNORMAL LOW (ref 22–32)
Calcium: 7 mg/dL — ABNORMAL LOW (ref 8.9–10.3)
Chloride: 93 mmol/L — ABNORMAL LOW (ref 98–111)
Creatinine, Ser: 2.96 mg/dL — ABNORMAL HIGH (ref 0.61–1.24)
GFR, Estimated: 23 mL/min — ABNORMAL LOW (ref >60)
Glucose, Bld: 212 mg/dL — ABNORMAL HIGH (ref 70–99)
Potassium: 6.7 mmol/L (ref 3.5–5.1)
Sodium: 128 mmol/L — ABNORMAL LOW (ref 135–145)

## 2020-07-05 LAB — PROCALCITONIN
Procalcitonin: 18.86 ng/mL
Procalcitonin: 30.48 ng/mL

## 2020-07-05 LAB — APTT: aPTT: 55 seconds — ABNORMAL HIGH (ref 24–36)

## 2020-07-05 LAB — HEPATIC FUNCTION PANEL
ALT: 4943 U/L — ABNORMAL HIGH (ref 0–44)
AST: >10000 U/L — ABNORMAL HIGH (ref 15–41)
Albumin: 1.8 g/dL — ABNORMAL LOW (ref 3.5–5.0)
Alkaline Phosphatase: 190 U/L — ABNORMAL HIGH (ref 38–126)
Bilirubin, Direct: 1.9 mg/dL — ABNORMAL HIGH (ref 0.0–0.2)
Indirect Bilirubin: 1.3 mg/dL — ABNORMAL HIGH (ref 0.3–0.9)
Total Bilirubin: 3.2 mg/dL — ABNORMAL HIGH (ref 0.3–1.2)
Total Protein: 5.2 g/dL — ABNORMAL LOW (ref 6.5–8.1)

## 2020-07-05 LAB — HEPARIN LEVEL (UNFRACTIONATED)
Heparin Unfractionated: <0.1 IU/mL — ABNORMAL LOW (ref 0.30–0.70)
Heparin Unfractionated: 0.14 IU/mL — ABNORMAL LOW (ref 0.30–0.70)

## 2020-07-05 LAB — GLUCOSE, CAPILLARY
Glucose-Capillary: 158 mg/dL — ABNORMAL HIGH (ref 70–99)
Glucose-Capillary: 79 mg/dL (ref 70–99)
Glucose-Capillary: 62 mg/dL — ABNORMAL LOW (ref 70–99)
Glucose-Capillary: 68 mg/dL — ABNORMAL LOW (ref 70–99)
Glucose-Capillary: 86 mg/dL (ref 70–99)
Glucose-Capillary: 71 mg/dL (ref 70–99)

## 2020-07-05 LAB — PROTIME-INR
INR: 4.2 (ref 0.8–1.2)
Prothrombin Time: 39 seconds — ABNORMAL HIGH (ref 11.4–15.2)

## 2020-07-05 LAB — LACTIC ACID, PLASMA
Lactic Acid, Venous: 9.8 mmol/L (ref 0.5–1.9)
Lactic Acid, Venous: 7.8 mmol/L (ref 0.5–1.9)

## 2020-07-05 LAB — HEMOGLOBIN A1C
Hgb A1c MFr Bld: 5.9 % — ABNORMAL HIGH (ref 4.8–5.6)
Mean Plasma Glucose: 122.63 mg/dL

## 2020-07-05 LAB — TROPONIN I (HIGH SENSITIVITY)
Troponin I (High Sensitivity): 2826 ng/L (ref <18)
Troponin I (High Sensitivity): 3410 ng/L (ref <18)

## 2020-07-05 LAB — MRSA PCR SCREENING: MRSA by PCR: NEGATIVE

## 2020-07-05 LAB — HIV ANTIBODY (ROUTINE TESTING W REFLEX): HIV Screen 4th Generation wRfx: NONREACTIVE

## 2020-07-05 LAB — CBG MONITORING, ED
Glucose-Capillary: 184 mg/dL — ABNORMAL HIGH (ref 70–99)
Glucose-Capillary: 188 mg/dL — ABNORMAL HIGH (ref 70–99)

## 2020-07-05 LAB — C-REACTIVE PROTEIN: CRP: 34 mg/dL — ABNORMAL HIGH (ref <1.0)

## 2020-07-05 LAB — FERRITIN: Ferritin: 1222 ng/mL — ABNORMAL HIGH (ref 24–336)

## 2020-07-05 LAB — TRIGLYCERIDES: Triglycerides: 238 mg/dL — ABNORMAL HIGH (ref <150)

## 2020-07-05 MED ORDER — SODIUM BICARBONATE 8.4 % IV SOLN
50.0000 meq | Freq: Once | INTRAVENOUS | Status: AC
  Administered 2020-07-05: 50 meq via INTRAVENOUS

## 2020-07-05 MED ORDER — HEPARIN SODIUM (PORCINE) 1000 UNIT/ML DIALYSIS
1000.0000 [IU] | INTRAMUSCULAR | Status: DC | PRN
Start: 2020-07-05 — End: 2020-07-06
  Filled 2020-07-05: qty 6

## 2020-07-05 MED ORDER — DEXTROSE 50 % IV SOLN
INTRAVENOUS | Status: AC
  Administered 2020-07-05: 25 mL via INTRAVENOUS
  Filled 2020-07-05: qty 50

## 2020-07-05 MED ORDER — ORAL CARE MOUTH RINSE
15.0000 mL | OROMUCOSAL | Status: DC
  Administered 2020-07-06 (×7): 15 mL via OROMUCOSAL

## 2020-07-05 MED ORDER — PATIROMER SORBITEX CALCIUM 8.4 G PO PACK
25.2000 g | PACK | Freq: Every day | ORAL | Status: AC
  Administered 2020-07-05: 25.2 g via ORAL
  Filled 2020-07-05: qty 3

## 2020-07-05 MED ORDER — CALCIUM GLUCONATE-NACL 1-0.675 GM/50ML-% IV SOLN
1.0000 g | Freq: Once | INTRAVENOUS | Status: AC
  Administered 2020-07-05: 1000 mg via INTRAVENOUS
  Filled 2020-07-05: qty 50

## 2020-07-05 MED ORDER — SODIUM BICARBONATE 8.4 % IV SOLN
100.0000 meq | Freq: Once | INTRAVENOUS | Status: AC
  Administered 2020-07-05: 100 meq via INTRAVENOUS

## 2020-07-05 MED ORDER — HEPARIN BOLUS VIA INFUSION
2350.0000 [IU] | Freq: Once | INTRAVENOUS | Status: AC
  Administered 2020-07-05: 2350 [IU] via INTRAVENOUS
  Filled 2020-07-05: qty 2350

## 2020-07-05 MED ORDER — INSULIN ASPART 100 UNIT/ML ~~LOC~~ SOLN
10.0000 [IU] | Freq: Once | SUBCUTANEOUS | Status: AC
  Administered 2020-07-05: 10 [IU] via INTRAVENOUS
  Filled 2020-07-05: qty 0.1

## 2020-07-05 MED ORDER — VANCOMYCIN HCL IN DEXTROSE 1-5 GM/200ML-% IV SOLN: 1000.0000 mg | INTRAVENOUS | Status: DC

## 2020-07-05 MED ORDER — PRISMASOL BGK 0/2.5 32-2.5 MEQ/L REPLACEMENT SOLN
Status: DC
  Filled 2020-07-05 (×3): qty 5000

## 2020-07-05 MED ORDER — SODIUM BICARBONATE 8.4 % IV SOLN
INTRAVENOUS | Status: AC
  Administered 2020-07-05: 50 meq via INTRAVENOUS
  Filled 2020-07-05: qty 100

## 2020-07-05 MED ORDER — ASPIRIN 81 MG PO CHEW: 81.0000 mg | CHEWABLE_TABLET | Freq: Every day | ORAL | Status: DC

## 2020-07-05 MED ORDER — SODIUM CHLORIDE 0.9 % FOR CRRT
INTRAVENOUS_CENTRAL | Status: DC | PRN
  Filled 2020-07-05: qty 1000

## 2020-07-05 MED ORDER — PHENYLEPHRINE CONCENTRATED 100MG/250ML (0.4 MG/ML) INFUSION SIMPLE
0.0000 ug/min | INTRAVENOUS | Status: DC
  Administered 2020-07-05: 20 ug/min via INTRAVENOUS
  Administered 2020-07-05: 285 ug/min via INTRAVENOUS
  Administered 2020-07-05: 250 ug/min via INTRAVENOUS
  Filled 2020-07-05 (×4): qty 250

## 2020-07-05 MED ORDER — DEXTROSE 50 % IV SOLN: 25.0000 mL | Freq: Once | INTRAVENOUS | Status: AC

## 2020-07-05 MED ORDER — SODIUM CHLORIDE 0.9 % IV SOLN
2.0000 g | INTRAVENOUS | Status: DC
  Administered 2020-07-05: 2 g via INTRAVENOUS
  Filled 2020-07-05 (×2): qty 2

## 2020-07-05 MED ORDER — VECURONIUM BROMIDE 10 MG IV SOLR
10.0000 mg | INTRAVENOUS | Status: DC | PRN
  Administered 2020-07-05: 10 mg via INTRAVENOUS

## 2020-07-05 MED ORDER — EPINEPHRINE HCL 5 MG/250ML IV SOLN IN NS
0.5000 ug/min | INTRAVENOUS | Status: DC
  Administered 2020-07-05: 2 ug/min via INTRAVENOUS
  Filled 2020-07-05 (×2): qty 250

## 2020-07-05 MED ORDER — CHLORHEXIDINE GLUCONATE CLOTH 2 % EX PADS
6.0000 | MEDICATED_PAD | Freq: Every day | CUTANEOUS | Status: DC
  Administered 2020-07-05: 6 via TOPICAL

## 2020-07-05 MED ORDER — DEXTROSE 50 % IV SOLN
1.0000 | Freq: Once | INTRAVENOUS | Status: AC
  Administered 2020-07-05: 50 mL via INTRAVENOUS
  Filled 2020-07-05: qty 50

## 2020-07-05 MED ORDER — PRISMASOL BGK 0/2.5 32-2.5 MEQ/L EC SOLN: Status: DC

## 2020-07-05 MED ORDER — VANCOMYCIN VARIABLE DOSE PER UNSTABLE RENAL FUNCTION (PHARMACIST DOSING): Status: DC

## 2020-07-05 MED ORDER — STERILE WATER FOR INJECTION IV SOLN
INTRAVENOUS | Status: DC
  Filled 2020-07-05: qty 850
  Filled 2020-07-05: qty 150
  Filled 2020-07-05: qty 850
  Filled 2020-07-05: qty 150
  Filled 2020-07-05 (×2): qty 850
  Filled 2020-07-05: qty 150
  Filled 2020-07-05 (×2): qty 850

## 2020-07-05 MED ORDER — FAMOTIDINE IN NACL 20-0.9 MG/50ML-% IV SOLN
20.0000 mg | INTRAVENOUS | Status: DC
  Administered 2020-07-06: 20 mg via INTRAVENOUS
  Filled 2020-07-05: qty 50

## 2020-07-05 MED ORDER — PIPERACILLIN-TAZOBACTAM 3.375 G IVPB
3.3750 g | Freq: Four times a day (QID) | INTRAVENOUS | Status: DC
  Administered 2020-07-06 (×2): 3.375 g via INTRAVENOUS
  Filled 2020-07-05: qty 50

## 2020-07-05 MED ORDER — CHLORHEXIDINE GLUCONATE 0.12% ORAL RINSE (MEDLINE KIT)
15.0000 mL | Freq: Two times a day (BID) | OROMUCOSAL | Status: DC
  Administered 2020-07-05 – 2020-07-06 (×2): 15 mL via OROMUCOSAL

## 2020-07-05 MED ORDER — METRONIDAZOLE 500 MG PO TABS
500.0000 mg | ORAL_TABLET | Freq: Three times a day (TID) | ORAL | Status: DC
  Administered 2020-07-05 (×2): 500 mg via ORAL
  Filled 2020-07-05 (×4): qty 1

## 2020-07-05 MED ORDER — STERILE WATER FOR INJECTION IV SOLN: INTRAVENOUS | Status: DC

## 2020-07-05 MED ORDER — SODIUM BICARBONATE 8.4 % IV SOLN
INTRAVENOUS | Status: AC
  Filled 2020-07-05: qty 300

## 2020-07-05 MED ORDER — VANCOMYCIN HCL 2000 MG/400ML IV SOLN
2000.0000 mg | Freq: Once | INTRAVENOUS | Status: AC
  Administered 2020-07-05: 2000 mg via INTRAVENOUS
  Filled 2020-07-05: qty 400

## 2020-07-05 MED FILL — Phenylephrine HCl IV Soln 10 MG/ML: INTRAVENOUS | Qty: 10 | Status: AC

## 2020-07-05 MED FILL — Sodium Chloride IV Soln 0.9%: INTRAVENOUS | Qty: 250 | Status: AC

## 2020-07-05 NOTE — ED Notes (Signed)
Annabelle Harman NP messaged via secure chat to notify of MAP 43 and maxed on pressors

## 2020-07-05 NOTE — Progress Notes (Addendum)
BRIEF CRITICAL CARE NOTE  Pt critically ill.  Pt severely hypoxia with O2 sats in 70's on vent.  Pt also hypotensive (SBP 70's) despite 3 vasopressors and stress dose steroids.  ABG with severe acidosis (mixed respiratory and metabolic acidosis).  Giving bicarb and placing on bicarb infusion.  Vent settings changed to rate 24 (previously 20) and 15 PEEP (12 previously). Will order for prn Vecuronium for vent dyschrony.  Have ordered for recruitment maneuvers and proning, however given that pt remains in ED due to no bed availability in ICU, unsure if these measures are able to be implemented in ED.   Post vent changes and vecuronium administration, SpO2 increased to 93%.  BP is currently improved to 91/54 (65) post bicarb administration.    Pt is critically ill, prognosis is guarded.  High risk for cardiac arrest and death.   Harlon Ditty, AGACNP-BC [facility] Pager: [phone]

## 2020-07-05 NOTE — ED Notes (Signed)
Cardiology at bedside.

## 2020-07-05 NOTE — Progress Notes (Signed)
*  PRELIMINARY RESULTS* Echocardiogram 2D Echocardiogram has been performed.  Derek Townsend 07/05/2020, 11:12 AM

## 2020-07-05 NOTE — Progress Notes (Signed)
Initial Nutrition Assessment  DOCUMENTATION CODES:   Obesity unspecified  INTERVENTION:  Plan is to hold off on initiation of tube feeds at this time.  If patient stabilizes enough for initiation of tube feeds recommend: -Initiate Vital AF 1.2 Cal at 15 mL/hr and advance by 15 mL/hr every 12 hours to goal rate of 45 mL/hr (1080 mL goal daily volume) per tube -PROSource Plus 90 mL BID per tube -Provides 1456 kcal, 125 grams of protein, 875 mL H2O daily -MVI daily per tube  NUTRITION DIAGNOSIS:   Inadequate oral intake related to inability to eat as evidenced by NPO status.  GOAL:   Patient will meet greater than or equal to 90% of their needs  MONITOR:   Vent status,Labs,Weight trends,TF tolerance,I & O's  REASON FOR ASSESSMENT:   Ventilator,Consult Enteral/tube feeding initiation and management  ASSESSMENT:   65 year old male with PMHx of HTN, DM, gout admitted with COVID-19 PNA, severe ARDS, NSTEMI, acute renal failure with acidosis.   1/10 intubated  Patient in ED at this time so unable to assess patient at bedside. RD received consult for TF initiation and management. Discussed with ICU NP. Plan is to hold off on TF initiation at this time.   Patient is currently intubated on ventilator support MV: 11 L/min Temp (24hrs), Avg:99.2 F (37.3 C), Min:97.4 F (36.3 C), Max:100.1 F (37.8 C)  Medications reviewed and include: vitamin C 500 mg daily, Colace 100 mg BID, Solu-Cortef 50 mg Q6hrs IV, Novolog 0-15 units Q4hrs, Flagyl, Miralax, zinc sulfate 220 mg daily, cefepime, epinephrine gtt at 3 mcg/min, famotidine, heparin infusion, norepinephrine gtt at 40 mcg/min, phenylephrine gtt at 400 mcg/min, sodium bicarbonate 150 mEq in sterile water at 125 mL/hr, vasopressin gtt at 0.04 units/min.  Labs reviewed: CBG 184-188, Sodium 128, Potassium 6.7, Chloride 93, CO2 15, Creatinine 2.96.  I/O: 750 mL UOP yesterday  Weight trend: wt documented at exactly 83 kg on  10/20/2015, 11/03/2015, and Jul 12, 2020 so suspect this is not a true measured weight; will continue to monitor for weight trend  Enteral Access: NG vs OG (tube not yet documented); terminates in stomach per chest x-ray 1/10  Discussed with RN and on rounds. No tube feeds. Patient with increasing pressor requirement. Possible intra-abdominal infection.  NUTRITION - FOCUSED PHYSICAL EXAM:  Unable to complete at this time.  Diet Order:   Diet Order            Diet NPO time specified  Diet effective now                EDUCATION NEEDS:   No education needs have been identified at this time  Skin:  Skin Assessment:  (RN skin assessment not yet completed at this time)  Last BM:  Unknown  Height:   Ht Readings from Last 1 Encounters:  2020-07-12 5\' 5"  (1.651 m)   Weight:   Wt Readings from Last 1 Encounters:  12-Jul-2020 83 kg   Ideal Body Weight:  61.8 kg  BMI:  Body mass index is 30.45 kg/m.  Estimated Nutritional Needs:   Kcal:  1406  Protein:  125-135 grams  Fluid:  >/= 2 L/day  09/01/20, MS, RD, LDN Pager number available on Amion

## 2020-07-05 NOTE — Progress Notes (Signed)
Brief Pharmacy Note  Patient to start on CRRT. Antibiotics changed to Zosyn. Will do Zosyn 3.375 g IV q6h as 30 min infusion. No other adjustments needed.  Laureen Ochs, PharmD

## 2020-07-05 NOTE — Consult Note (Signed)
ANTICOAGULATION CONSULT NOTE  Pharmacy Consult for Heparin Infusion Indication: chest pain/ACS  Patient Measurements: Heparin Dosing Weight: 78.7 kg  Labs: Recent Labs    07/03/2020 1041 07/01/2020 1216 07/05/2020 2022 07/05/20 0038 07/05/20 0039  HGB 12.7*  --   --   --   --   HCT 37.5*  --   --   --   --   PLT 208  --   --   --   --   APTT  --  48*  --   --   --   LABPROT  --  23.4*  --   --   --   INR  --  2.2*  --   --   --   HEPARINUNFRC  --   --  <0.10*  --   --   CREATININE 1.51*  --   --   --  2.96*  TROPONINIHS 606* 614*  --  2,826*  --     Estimated Creatinine Clearance: 24.3 mL/min (A) (by C-G formula based on SCr of 2.96 mg/dL (H)).   Medical History: Past Medical History:  Diagnosis Date  . Diabetes mellitus without complication (HCC)    diet controlled  . Gout   . Hypertension     Medications:  No anticoagulation prior to admission per my chart review. Medication reconciliation is pending.  Assessment: Patient is a 65 y/o M with medical history as above who presented to the ED 1/10 with respiratory distress in setting of known SARS-CoV-2 positivity. Troponin elevated to 606. Pharmacy has been consulted to initiate heparin infusion for suspected ACS.  Baseline CBC with Hgb 12.7, platelets within normal limits. Baseline aPTT and PT-INR pending. May be elevated in the setting of liver injury given transaminitis.    Goal of Therapy:  Heparin level 0.3-0.7 units/ml Monitor platelets by anticoagulation protocol: Yes   Plan:  1/11: HL @ 2022 = < 0.1 Heparin 2350 units IV X 1 bolus and increase drip rate to 1200 units/hr.  Will recheck HL 8 hrs after rate change.   --Caprisha Bridgett D 07/05/2020,3:26 AM

## 2020-07-05 NOTE — Consult Note (Signed)
[address] [place], [place] [address] Phone [phone]. Fax [phone]  Date: 07/05/2020                  Patient Name:  Derek Townsend  MRN: [id number]  DOB: 28-Jan-1956  Age / Sex: 65 y.o., male         [facility]P: [facility]                 Service Requesting Consult: IM/ Erin Fulling, MD                 Reason for Consult: ARF            History of Present Illness: Patient is a 65 y.o. male admitted to [facility] on 2020/07/08 for evaluation of ARDS (adult respiratory distress syndrome) (HCC) [J80] Bilirubinemia [E80.6] Respiratory distress [R06.03] Hypoxia [R09.02] Encounter for central line placement [Z45.2] NSTEMI (non-ST elevated myocardial infarction) (HCC) [I21.4] COVID-19 [U07.1]   Nephrology consult for acute kidney injury.  Presenting creatinine of 1.51 on January 10 which has rapidly worsened to 2.96.  Patient was hypotensive and was intubated in the emergency room on January 11. Patient was also found to be severely hyperkalemic with potassium of 6.7 AST, ALT are critically elevated Patient requiring multiple pressors     Medications: Outpatient medications: (Not in a hospital admission)   Current medications: Current Facility-Administered Medications  Medication Dose Route Frequency Provider Last Rate Last Admin  . 0.9 %  sodium chloride infusion  250 mL Intravenous PRN Erin Fulling, MD      . acetaminophen (TYLENOL) tablet 650 mg  650 mg Oral Q4H PRN Erin Fulling, MD      . ascorbic acid (VITAMIN C) tablet 500 mg  500 mg Per Tube Daily Harlon Ditty D, NP   500 mg at 07/05/20 0439  . ceFEPIme (MAXIPIME) 2 g in sodium chloride 0.9 % 100 mL IVPB  2 g Intravenous Q24H Erin Fulling, MD   Stopped at 07/05/20 0539  . docusate (COLACE) 50 MG/5ML liquid 100 mg  100 mg Per Tube BID Erin Fulling, MD      . docusate sodium (COLACE) capsule 100 mg  100 mg Oral BID PRN Erin Fulling, MD      . EPINEPHrine (ADRENALIN) 4 mg in NS 250 mL  (0.016 mg/mL) premix infusion  0.5-20 mcg/min Intravenous Titrated Judithe Modest, NP   Held at 07/05/20 0401  . famotidine (PEPCID) IVPB 20 mg premix  20 mg Intravenous Q12H Erin Fulling, MD   Stopped at 07/05/20 0315  . fentaNYL (SUBLIMAZE) bolus via infusion 25 mcg  25 mcg Intravenous Q15 min PRN Erin Fulling, MD      . fentaNYL (SUBLIMAZE) injection 25 mcg  25 mcg Intravenous Once Erin Fulling, MD      . fentaNYL in NS (57mcg/ml) infusion-PREMIX  25-200 mcg/hr Intravenous Continuous Erin Fulling, MD 10 mL/hr at 07-08-20 1630 100 mcg/hr at 07-08-20 1630  . heparin ADULT infusion 100 units/mL (25000 units/271mL)  1,200 Units/hr Intravenous Continuous Erin Fulling, MD 12 mL/hr at 07/05/20 0403 1,200 Units/hr at 07/05/20 0403  . hydrocortisone sodium succinate (SOLU-CORTEF) 100 MG injection 50 mg  50 mg Intravenous Q6H Harlon Ditty D, NP   50 mg at 07/05/20 0219  . insulin aspart (novoLOG) injection 0-15 Units  0-15 Units Subcutaneous Q4H Harlon Ditty D, NP   3 Units at 07/05/20 [phone]  . metroNIDAZOLE (FLAGYL) tablet 500 mg  500 mg Oral Q8H Judithe Modest,  NP   500 mg at 07/05/20 0643  . norepinephrine (LEVOPHED) 16 mg in premix infusion  0-40 mcg/min Intravenous Titrated Harlon Ditty D, NP 37.5 mL/hr at 07/05/20 0248 40 mcg/min at 07/05/20 0248  . ondansetron (ZOFRAN) injection 4 mg  4 mg Intravenous Q6H PRN Erin Fulling, MD      . phenylephrine CONCENTRATED 100mg  in sodium chloride 0.9% (0.4mg /mL) infusion  0-400 mcg/min Intravenous Titrated D, NP 10.5 mL/hr at 07/05/20 0648 70 mcg/min at 07/05/20 0648  . polyethylene glycol (MIRALAX / GLYCOLAX) packet 17 g  17 g Oral Daily PRN 09/02/20, MD      . polyethylene glycol (MIRALAX / GLYCOLAX) packet 17 g  17 g Per Tube Daily Kasa, Kurian, MD      . propofol (DIPRIVAN) 1000 MG/100ML infusion  0-50 mcg/kg/min Intravenous Continuous Erin Fulling, MD 2.49 mL/hr at 07/08/2020 1630 5 mcg/kg/min at  07/05/2020 1630  . sodium bicarbonate 150 mEq in sterile water 1,000 mL infusion   Intravenous Continuous 09/01/20, NP 125 mL/hr at 07/05/20 0120 New Bag at 07/05/20 0120  . sodium chloride flush (NS) 0.9 % injection 3 mL  3 mL Intravenous Q12H Kasa, Kurian, MD      . sodium chloride flush (NS) 0.9 % injection 3 mL  3 mL Intravenous PRN 09/02/20, MD      . Erin Fulling ON 26-Jul-2020] vancomycin (VANCOCIN) IVPB 1000 mg/200 mL premix  1,000 mg Intravenous Q24H 09/03/2020, MD      . vancomycin (VANCOREADY) IVPB 2000 mg/400 mL  2,000 mg Intravenous Once Erin Fulling, MD 200 mL/hr at 07/05/20 0651 2,000 mg at 07/05/20 0651  . vasopressin (PITRESSIN) 20 Units in sodium chloride 0.9 % 100 mL infusion-*FOR SHOCK*  0-0.04 Units/min Intravenous Continuous 11-30-1997, NP 12 mL/hr at 07/05/20 0753 0.04 Units/min at 07/05/20 0753  . vecuronium (NORCURON) injection 10 mg  10 mg Intravenous Q1H PRN 09/02/20 D, NP   10 mg at 07/05/20 0022  . zinc sulfate capsule 220 mg  220 mg Per Tube Daily 09/02/20 D, NP   220 mg at 07/05/20 0447   Current Outpatient Medications  Medication Sig Dispense Refill  . Multiple Vitamins-Minerals (MULTIVITAMIN WITH MINERALS) tablet Take 1 tablet by mouth daily.    . pravastatin (PRAVACHOL) 40 MG tablet Take 40 mg by mouth at bedtime.        Allergies: No Known Allergies    Past Medical History: Past Medical History:  Diagnosis Date  . Diabetes mellitus without complication (HCC)    diet controlled  . Gout   . Hypertension      Past Surgical History: Past Surgical History:  Procedure Laterality Date  . JOINT REPLACEMENT Left October 2014   Dr. November 2014, [facility]  . 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 No pertinent family history.   Social History: Social  History   Socioeconomic History  . Marital status: Married    Spouse name: Not on file  . Number of children: Not on file  . Years of education: Not on file  . Highest education level: Not on file  Occupational History  . Not on file  Tobacco Use  . Smoking status: Former Smoker    Packs/day: 2.00    Types: Cigarettes    Quit date: 11/24/1979  Years since quitting: 40.6  . Smokeless tobacco: Never Used  Substance and Sexual Activity  . Alcohol use: No  . Drug use: No  . Sexual activity: Not on file  Other Topics Concern  . Not on file  Social History Narrative  . Not on file   Social Determinants of Health   Financial Resource Strain: Not on file  Food Insecurity: Not on file  Transportation Needs: Not on file  Physical Activity: Not on file  Stress: Not on file  Social Connections: Not on file  Intimate Partner Violence: Not on file     Review of Systems: Not available Gen:  HEENT:  CV:  Resp:  GI: GU :  MS:  Derm:    Psych: Heme:  Neuro:  Endocrine  Vital Signs: Blood pressure (!) 50/33, pulse (!) 104, temperature 99.6 F (37.6 C), resp. rate (!) 24, height 5\' 5"  (1.651 m), weight 83 kg, SpO2 94 %.   Intake/Output Summary (Last 24 hours) at 07/05/2020 0817 Last data filed at 07/05/2020 0700 Gross per 24 hour  Intake 731.14 ml  Output 750 ml  Net -18.86 ml    Weight trends: 09/02/2020   07/24/2020 1038  Weight: 83 kg    Physical Exam: General:  Critically ill-appearing  HEENT  ET tube in place, conjunctival edema, anicteric  Lungs:  Ventilator assisted  Heart::  Tachycardic  Abdomen:  Soft, nondistended  Extremities:  Trace edema  Neurologic:  Sedated  Skin:  Feet and hands cool to touch  Access:   Foley:  In place       Lab results: Basic Metabolic Panel: Recent Labs  Lab 07/22/2020 1041 07/05/20 0039  NA 129* 128*  K 4.1 6.7*  CL 90* 93*  CO2 13* 15*  GLUCOSE 173* 212*  BUN 17 21  CREATININE 1.51* 2.96*  CALCIUM 8.4*  7.0*  MG 2.8*  --     Liver Function Tests: Recent Labs  Lab 07/05/2020 1041  AST 480*  ALT 317*  ALKPHOS 111  BILITOT 1.9*  PROT 7.7  ALBUMIN 2.7*   No results for input(s): LIPASE, AMYLASE in the last 168 hours. No results for input(s): AMMONIA in the last 168 hours.  CBC: Recent Labs  Lab 07/23/2020 1041  WBC 21.5*  NEUTROABS 17.0*  HGB 12.7*  HCT 37.5*  MCV 82.8  PLT 208    Cardiac Enzymes: No results for input(s): CKTOTAL, TROPONINI in the last 168 hours.  BNP: Invalid input(s): POCBNP  CBG: Recent Labs  Lab 07/05/20 0618  GLUCAP 184*    Microbiology: Recent Results (from the past 720 hour(s))  Culture, blood (single)     Status: None (Preliminary result)   Collection Time: 07/18/2020 10:41 AM   Specimen: BLOOD  Result Value Ref Range Status   Specimen Description BLOOD RIGHT [facility]  Final   Special Requests   Final    BOTTLES DRAWN AEROBIC AND ANAEROBIC Blood Culture results may not be optimal due to an inadequate volume of blood received in culture bottles   Culture   Final    NO GROWTH < 24 HOURS Performed at [facility], [address], [place], [place]    Report Status PENDING  Incomplete  Blood culture (single)     Status: None (Preliminary result)   Collection Time: 07/05/2020 10:41 AM   Specimen: BLOOD  Result Value Ref Range Status   Specimen Description BLOOD BLOOD RIGHT HAND  Final   Special Requests   Final  BOTTLES DRAWN AEROBIC AND ANAEROBIC Blood Culture results may not be optimal due to an inadequate volume of blood received in culture bottles   Culture   Final    NO GROWTH < 24 HOURS Performed at [facility], [address] [place], [place] [id number]    Report Status PENDING  Incomplete     Coagulation Studies: Recent Labs    06/19/21 1216  LABPROT 23.4*  INR 2.2*    Urinalysis: No results for input(s): COLORURINE, LABSPEC, PHURINE, GLUCOSEU, HGBUR, BILIRUBINUR, KETONESUR, PROTEINUR,  UROBILINOGEN, NITRITE, LEUKOCYTESUR in the last 72 hours.  Invalid input(s): APPERANCEUR      Imaging: DG Chest 1 View  Result Date: 03/28/21 CLINICAL DATA:  Respiratory distress, positive COVID test 10 days ago, post intubation. EXAM: CHEST  1 VIEW COMPARISON:  Earlier the same day at 1052 hours. FINDINGS: Endotracheal tube terminates 1.4 cm above the carina. Heart size stable. Worsening patchy bilateral airspace opacification with low lung volumes. No definite pleural fluid. IMPRESSION: 1. Endotracheal tube is low lying. Retracting 3-4 cm would better position the tip above the carina. 2. Worsening bilateral airspace opacification, indicative of COVID-19 pneumonia. Electronically Signed   By: Leanna BattlesMelinda  Blietz M.D.   On: 010/04/22 13:15   DG Abdomen 1 View  Result Date: 03/28/21 CLINICAL DATA:  Nasogastric tube placement. EXAM: ABDOMEN - 1 VIEW COMPARISON:  None. FINDINGS: The bowel gas pattern is normal. Distal tip of nasogastric tube is seen in the proximal stomach. No radio-opaque calculi or other significant radiographic abnormality are seen. IMPRESSION: Distal tip of nasogastric tube seen in proximal stomach. Electronically Signed   By: Lupita RaiderJames  Green Jr M.D.   On: 010/04/22 14:06   DG Chest Port 1 View  Result Date: 07/05/2020 CLINICAL DATA:  Central line placement EXAM: PORTABLE CHEST 1 VIEW COMPARISON:  010/04/22 FINDINGS: Endotracheal tube 3.5 cm above the carina. Right central line tip in the SVC. No pneumothorax. NG tube is in the stomach. Bilateral airspace disease, right greater than left, unchanged. Heart is normal size. IMPRESSION: Right central line tip in the SVC.  No pneumothorax. Other support devices as above. Stable bilateral airspace disease. Electronically Signed   By: Charlett NoseKevin  Dover M.D.   On: 07/05/2020 00:17   DG Chest Portable 1 View  Result Date: 03/28/21 CLINICAL DATA:  Respiratory distress.  Hypoxia.  COVID pneumonia. EXAM: PORTABLE CHEST 1 VIEW COMPARISON:   None. FINDINGS: Mildly low lung volumes with indistinct pulmonary vasculature and hazy interstitial and patchy airspace opacities in both lungs. Borderline enlargement of the cardiopericardial silhouette. No blunting of the costophrenic angles. Thoracic spondylosis. Prominent upper mediastinum is nonspecific and may be related to portable AP technique. IMPRESSION: 1. Bilateral interstitial and patchy airspace opacities in both lungs, compatible with atypical pneumonia or edema. 2. Borderline enlargement of the cardiopericardial silhouette. 3. Thoracic spondylosis. Electronically Signed   By: Gaylyn RongWalter  Liebkemann M.D.   On: 010/04/22 11:14   US Abdomen Limited RUQ (LIVER/GB)  Result Date: 03/28/21 CLINICAL DATA:  Hyperbilirubinemia. EXAM: ULTRASOUND ABDOMEN LIMITED RIGHT UPPER QUADRANT COMPARISON:  None. FINDINGS: Gallbladder: Diffuse gallbladder wall thickening with pericholecystic fluid. No gallstones or gallbladder dilation. Sonographic Murphy sign not evaluated by sonographer as patient is intubated. Common bile duct: Diameter: 2.9 mm Liver: No focal lesion identified. Diffusely increased echogenicity. Portal vein is patent on color Doppler imaging with normal direction of blood flow towards the liver. Other: None. IMPRESSION: 1. Peri cholecystic fluid with diffuse gallbladder wall thickening without gallstones or gallbladder/common bile duct dilation.  Findings which are favored to be sequela of hepatic dysfunction. If concern for cholecystitis recommend nuclear medicine HIDA scan. 2. The echogenicity of the liver is increased. This is a nonspecific finding but is most commonly seen with fatty infiltration. There are no obvious focal liver lesions. Electronically Signed   By: Maudry MayhewJeffrey  Waltz MD   On: 04/17/2021 17:22      Assessment & Plan: Pt is a 65 y.o.   male with diabetes, gout, hypertension, was admitted on 24-May-2021 with ARDS (adult respiratory distress syndrome) (HCC) [J80]   #Acute kidney  injury -Last known baseline of 1.5 on July 04, 2020  #Hyperkalemia #Acute respiratory distress syndrome, acute respiratory failure #COVID-19 pneumonia #Severe lactic acidosis  #Hyponatremia  Plan: Supportive care and treatment for ARDS/COVID-19 as per ICU protocols Patient is oliguric and has developed severe hyperkalemia, severe acidosis, hyponatremia.  Unlikely to respond to medical therapy.  Therefore recommend CRRT.  ICU team to place dialysis catheter.    LOS: 1 Amori Cooperman 1/11/20228:17 AM    Note: This note was prepared with Dragon dictation. Any transcription errors are unintentional

## 2020-07-05 NOTE — Progress Notes (Addendum)
Name: Derek Townsend MRN: [id number] DOB: 08-27-1955    CHIEF COMPLAINT:  COVID 19 pnuemonia HISTORY OF PRESENT ILLNESS:  65 yo white male with severe hypoxia Dx with COVID 1 week ago Developed SOB and chest congestion acutely in last 24 hrs  Patient with severe hypoxia with confusion with b/l Infiltrates on CXR Patient emergently intubated and sedated  PAST MEDICAL HISTORY :   has a past medical history of Diabetes mellitus without complication (HCC), Gout, and Hypertension.  has a past surgical history that includes Joint replacement (Left, October 2014) and Total hip arthroplasty (Right, 11/03/2015). Prior to Admission medications   Medication Sig Start Date End Date Taking? Authorizing Provider  acetaminophen (TYLENOL) 325 MG tablet Take 650 mg by mouth every 6 (six) hours as needed.    [provider]  allopurinol (ZYLOPRIM) 100 MG tablet Take 200 mg by mouth daily.    [provider]  amLODipine (NORVASC) 10 MG tablet Take 10 mg by mouth daily.    [provider]  enoxaparin (LOVENOX) 40 MG/0.4ML injection Inject 0.4 mLs (40 mg total) into the skin daily. X 14 days 11/05/15 11/19/15  Evon Slack, PA-C  meloxicam (MOBIC) 7.5 MG tablet Take 7.5 mg by mouth daily.    [provider]  ondansetron (ZOFRAN) 4 MG tablet Take 1 tablet (4 mg total) by mouth every 6 (six) hours as needed for nausea. 11/05/15   Evon Slack, PA-C  oxyCODONE (OXY IR/ROXICODONE) 5 MG immediate release tablet Take 1-2 tablets (5-10 mg total) by mouth every 3 (three) hours as needed for breakthrough pain. 11/05/15   Evon Slack, PA-C  pravastatin (PRAVACHOL) 40 MG tablet Take 40 mg by mouth at bedtime.    [provider]   No Known Allergies  FAMILY HISTORY:  family history is not on file. SOCIAL HISTORY:  reports that he quit smoking about 40 years ago. His smoking use included cigarettes. He smoked 2.00 packs per day. He has never used smokeless  tobacco. He reports that he does not drink alcohol and does not use drugs.  STUDIES: US Abdomen Limited RUQ 01/10>>peri cholecystic fluid with diffuse gallbladder wall thickening without gallstones or gallbladder/common bile duct dilation. Findings which are favored to be sequela of hepatic dysfunction. If concern for cholecystitis recommend nuclear medicine HIDA scan. The echogenicity of the liver is increased. This is a nonspecific finding but is most commonly seen with fatty infiltration. There are no obvious focal liver lesions. Echo 01/11>>  REVIEW OF SYSTEMS:   Unable to obtain due to critical illness     Estimated body mass index is 30.45 kg/m as calculated from the following:   Height as of this encounter: 5\' 5"  (1.651 m).   Weight as of this encounter: 83 kg.  VITAL SIGNS: Temp:  [97.4 F (36.3 C)-100.1 F (37.8 C)] 99.6 F (37.6 C) (01/11 0750) Pulse Rate:  [99-135] 104 (01/11 0750) Resp:  [21-30] 24 (01/11 0750) BP: (50-134)/(16-91) 50/33 (01/11 0750) SpO2:  [71 %-94 %] 91 % (01/11 0816) FiO2 (%):  [100 %] 100 % (01/11 0816) Weight:  [83 kg] 83 kg (01/10 1038)  I/O last 3 completed shifts: In: 731.1 [I.V.:681.1; IV Piggyback:50] Out: 750 [Urine:750] No intake/output data recorded.  SpO2: 91 % FiO2 (%): 100 %  Physical Examination:  GENERAL:critically ill appearing. NAD mechanically intubated .  HEAD: Normocephalic, atraumatic.  EYES: Pupils 2 mmm sluggish. No scleral icterus.  MOUTH: Moist mucosal membrane. NECK: Supple. No JVD.  PULMONARY: faint rhonchi throughout, even, non labored  CARDIOVASCULAR: S1 and S2. Regular rate and rhythm. No murmurs, rubs, or gallops. 1+ radial and 1+ distals present  GASTROINTESTINAL: Soft, nontender, -distended.  Hypoactive BS x4 MUSCULOSKELETAL: Normal bulk and tone.  No edema  NEUROLOGIC: Sedated, not following commands  SKIN: Intact,warm,dry  I personally reviewed lab work that was obtained in last 24 hrs. CXR  Independently reviewed-b/l interstitial infiltrates  MEDICATIONS: I have reviewed all medications and confirmed regimen as documented   CULTURE RESULTS   Recent Results (from the past 240 hour(s))  Culture, blood (single)     Status: None (Preliminary result)   Collection Time: 26-Jul-2020 10:41 AM   Specimen: BLOOD  Result Value Ref Range Status   Specimen Description BLOOD RIGHT AC  Final   Special Requests   Final    BOTTLES DRAWN AEROBIC AND ANAEROBIC Blood Culture results may not be optimal due to an inadequate volume of blood received in culture bottles   Culture   Final    NO GROWTH < 24 HOURS Performed at [facility], [address] [place], [place] [address]    Report Status PENDING  Incomplete  Blood culture (single)     Status: None (Preliminary result)   Collection Time: 2020/07/26 10:41 AM   Specimen: BLOOD  Result Value Ref Range Status   Specimen Description BLOOD BLOOD RIGHT HAND  Final   Special Requests   Final    BOTTLES DRAWN AEROBIC AND ANAEROBIC Blood Culture results may not be optimal due to an inadequate volume of blood received in culture bottles   Culture   Final    NO GROWTH < 24 HOURS Performed at [facility], [address] [place], [place] [address]    Report Status PENDING  Incomplete      IMAGING   DG Chest 1 View  Result Date: 2020-07-26 CLINICAL DATA:  Respiratory distress, positive COVID test 10 days ago, post intubation. EXAM: CHEST  1 VIEW COMPARISON:  Earlier the same day at 1052 hours. FINDINGS: Endotracheal tube terminates 1.4 cm above the carina. Heart size stable. Worsening patchy bilateral airspace opacification with low lung volumes. No definite pleural fluid. IMPRESSION: 1. Endotracheal tube is low lying. Retracting 3-4 cm would better position the tip above the carina. 2. Worsening bilateral airspace opacification, indicative of COVID-19 pneumonia. Electronically Signed   By: Leanna Battles M.D.   On: 2020-07-26  13:15   DG Abdomen 1 View  Result Date: Jul 26, 2020 CLINICAL DATA:  Nasogastric tube placement. EXAM: ABDOMEN - 1 VIEW COMPARISON:  None. FINDINGS: The bowel gas pattern is normal. Distal tip of nasogastric tube is seen in the proximal stomach. No radio-opaque calculi or other significant radiographic abnormality are seen. IMPRESSION: Distal tip of nasogastric tube seen in proximal stomach. Electronically Signed   By: Lupita Raider M.D.   On: 07-26-2020 14:06   DG Chest Port 1 View  Result Date: 07/05/2020 CLINICAL DATA:  Central line placement EXAM: PORTABLE CHEST 1 VIEW COMPARISON:  Jul 26, 2020 FINDINGS: Endotracheal tube 3.5 cm above the carina. Right central line tip in the SVC. No pneumothorax. NG tube is in the stomach. Bilateral airspace disease, right greater than left, unchanged. Heart is normal size. IMPRESSION: Right central line tip in the SVC.  No pneumothorax. Other support devices as above. Stable bilateral airspace disease. Electronically Signed   By: Charlett Nose M.D.   On: 07/05/2020 00:17   DG Chest Portable 1 View  Result Date: July 26, 2020  CLINICAL DATA:  Respiratory distress.  Hypoxia.  COVID pneumonia. EXAM: PORTABLE CHEST 1 VIEW COMPARISON:  None. FINDINGS: Mildly low lung volumes with indistinct pulmonary vasculature and hazy interstitial and patchy airspace opacities in both lungs. Borderline enlargement of the cardiopericardial silhouette. No blunting of the costophrenic angles. Thoracic spondylosis. Prominent upper mediastinum is nonspecific and may be related to portable AP technique. IMPRESSION: 1. Bilateral interstitial and patchy airspace opacities in both lungs, compatible with atypical pneumonia or edema. 2. Borderline enlargement of the cardiopericardial silhouette. 3. Thoracic spondylosis. Electronically Signed   By: Gaylyn RongWalter  Liebkemann M.D.   On: 05-Aug-2020 11:14   US Abdomen Limited RUQ (LIVER/GB)  Result Date: 07-09-20 CLINICAL DATA:  Hyperbilirubinemia. EXAM:  ULTRASOUND ABDOMEN LIMITED RIGHT UPPER QUADRANT COMPARISON:  None. FINDINGS: Gallbladder: Diffuse gallbladder wall thickening with pericholecystic fluid. No gallstones or gallbladder dilation. Sonographic Murphy sign not evaluated by sonographer as patient is intubated. Common bile duct: Diameter: 2.9 mm Liver: No focal lesion identified. Diffusely increased echogenicity. Portal vein is patent on color Doppler imaging with normal direction of blood flow towards the liver. Other: None. IMPRESSION: 1. Peri cholecystic fluid with diffuse gallbladder wall thickening without gallstones or gallbladder/common bile duct dilation. Findings which are favored to be sequela of hepatic dysfunction. If concern for cholecystitis recommend nuclear medicine HIDA scan. 2. The echogenicity of the liver is increased. This is a nonspecific finding but is most commonly seen with fatty infiltration. There are no obvious focal liver lesions. Electronically Signed   By: Maudry MayhewJeffrey  Waltz MD   On: 05-Aug-2020 17:22     Nutrition Status: NPO for now        Indwelling Urinary Catheter continued, requirement due to   Reason to continue Indwelling Urinary Catheter strict Intake/Output monitoring for hemodynamic instability         Ventilator continued, requirement due to severe respiratory failure   Ventilator Sedation RASS 0 to -2      ASSESSMENT AND PLAN SYNOPSIS  65 yo obese white male with severe ARDS with severe and acute hypoxic resp failure due to COVID 19 pneumonia causing NSTEMI and acute renal failure with acidosis  Severe ACUTE Hypoxic and Hypercapnic Respiratory Failure -continue Full MV support -continue Bronchodilator Therapy -Wean Fio2 and PEEP as tolerated -VAP/VENT bundle implementation  Acute hypoxemic respiratory failure due to COVID-19 pneumonia SEVERE ARDS -Mechanical ventilation via ARDS protocol, target PRVC 6 cc/kg -Wean PEEP and FiO2 as able -Goal plateau pressure less than 30, driving  pressure less than 15 -Paralytics if necessary for vent synchrony, gas exchange -Cycle prone positioning if necessary for oxygenation -Daily inflammatory markers -Diuresis as blood pressure and renal function can tolerate, goal CVP  5-8.   -VAP/VENT bundle prevention order set -IV steroids  Morbid obesity, possible OSA.   Will certainly impact respiratory mechanics, ventilator weaning -Suspect will need to consider additional PEEP  Hypotension secondary to sepsis  Elevated troponin's secondary to demand ischemia vs. NSTEMI  Hx: HTN -Continuous telemetry monitoring -Continue vasopressin, levophed, neo-synephrine, and epinephrine gtt as needed to maintain map >65 -Continue stress dose steroids-wean as tolerated -Trend troponin's -Echo pending -Heparin gtt-dosing per pharmacy  -Cardiology consulted appreciate input    ACUTE KIDNEY INJURY/Renal Failure with Hyperkalemia-worsening  Severe anion gap metabolic acidosis and lactic acidosis  Hyponatremia  -Continue Foley Catheter-assess need -Avoid nephrotoxic agents -Follow urine output, BMP, lactic acid, and vbg  -Ensure adequate renal perfusion, optimize oxygenation -Renal dose medications -Replace electrolytes as indicated  -Nephrology consulted appreciate input-pt will likely need CRRT  -  Continue sodium bicarb gtt for now   Transaminitis secondary to septic shock -Trend hepatic panel  -Monitor coags   Sepsis secondary to COVID-19 pneumonia and possible intra-abdominal infection  -Trend WBC and monitor fever curve  -Trend PCT -Abx therapy broadened 01/11: metronidazole, vancomycin, and cefepime if MRSA PCR negative will discontinue vancomycin -Follow up cultures  Type II diabetes mellitus  -CBG's q4hrs  -SSI    Acute toxic metabolic encephalopathy, need for sedation Goal RASS -2 to -3 Fentanyl gtt and prn versed to maintain RASS goal  WUA daily   GI GI PROPHYLAXIS: IV pepcid   NUTRITIONAL STATUS DIET-->NPO for now   Constipation protocol as indicated  DVT/GI PRX ordered and assessed TRANSFUSIONS AS NEEDED I Assessed the need for Labs I Assessed the need for Foley I Assessed the need for Central Venous Line Family Discussion when available I Assessed the need for Mobilization I made an Assessment of medications to be adjusted accordingly Safety Risk assessment Completed  CASE DISCUSSED IN MULTIDISCIPLINARY ROUNDS WITH ICU TEAM   Critical Care Time devoted to patient care services described in this note is 50 minutes.   Overall, patient is critically ill, prognosis is guarded.  Patient with Multiorgan failure and at high risk for cardiac arrest and death.    Discussed pts poor prognosis (pt now requiring levophed, neo- synephrine, and vasopressin gtts to maintain map >65), code status, and plan of care with pts wife Liberty Stead via telephone.  Following discussion Mrs. Robison changed pts code status to DNR.  However, she would like to proceed with dialysis if indicated.  All questions were answered. Case discussed with ICU Intensivist Dr. Belia Heman.  Sonda Rumble, AGNP  Pulmonary/Critical Care Pager [phone] (please enter 7 digits) PCCM Consult Pager [phone] (please enter 7 digits)

## 2020-07-05 NOTE — Progress Notes (Signed)
Pharmacy Antibiotic Note  Derek Townsend is a 65 y.o. male admitted on 07-22-2020 with sepsis, AKI.  Pharmacy has been consulted for Vanc, Cefepime dosing.  Plan: Vancomycin 2 gm IV X 1 ordered for 1/11 @ 0400. Vancomycin 1 gm IV Q24H ordered to start on 1/12 @ 0400.  Cefepime 2 gm IV Q24H ordered to start on 1/11 @ 0300.   Height: 5\' 5"  (165.1 cm) Weight: 83 kg (182 lb 15.7 oz) IBW/kg (Calculated) : 61.5  Temp (24hrs), Avg:98.9 F (37.2 C), Min:97.4 F (36.3 C), Max:100.1 F (37.8 C)  Recent Labs  Lab Jul 22, 2020 1041 07-22-20 1216 2020/07/22 1337 07/05/20 0039 07/05/20 0041  WBC 21.5*  --   --   --   --   CREATININE 1.51*  --   --  2.96*  --   LATICACIDVEN >11.0* >11.0* >11.0*  --  9.8*    Estimated Creatinine Clearance: 24.3 mL/min (A) (by C-G formula based on SCr of 2.96 mg/dL (H)).    No Known Allergies  Antimicrobials this admission:   >>    >>   Dose adjustments this admission:   Microbiology results:  BCx:   UCx:    Sputum:    MRSA PCR:   Thank you for allowing pharmacy to be a part of this patient's care.  Derek Townsend D 07/05/2020 3:06 AM

## 2020-07-05 NOTE — ED Notes (Signed)
Per Dr Belia Heman, verbal order for 6 amps of sodium bicarb

## 2020-07-05 NOTE — Progress Notes (Signed)
ABG attempted X2 by 2 RT's all unsuccessful. NP Elvina Sidle aware.

## 2020-07-05 NOTE — ED Notes (Signed)
Dr Belia Heman notified of pt BP deteriorating with MAP in 30s with increased phenylephrine

## 2020-07-05 NOTE — Consult Note (Signed)
ANTICOAGULATION CONSULT NOTE  Pharmacy Consult for Heparin Infusion Indication: chest pain/ACS  Patient Measurements: Heparin Dosing Weight: 78.7 kg  Labs: Recent Labs    07/23/2020 1041 07/11/2020 1216 07/11/2020 2022 07/05/20 0038 07/05/20 0039 07/05/20 0909 07/05/20 1212  HGB 12.7*  --   --   --   --   --   --   HCT 37.5*  --   --   --   --   --   --   PLT 208  --   --   --   --   --   --   APTT  --  48*  --   --   --   --   --   LABPROT  --  23.4*  --   --   --   --   --   INR  --  2.2*  --   --   --   --   --   HEPARINUNFRC  --   --  <0.10*  --   --   --  0.14*  CREATININE 1.51*  --   --   --  2.96*  --   --   TROPONINIHS 606* 614*  --  2,826*  --  3,410*  --     Estimated Creatinine Clearance: 24.3 mL/min (A) (by C-G formula based on SCr of 2.96 mg/dL (H)).   Medical History: Past Medical History:  Diagnosis Date  . Diabetes mellitus without complication (HCC)    diet controlled  . Gout   . Hypertension     Medications:  No anticoagulation prior to admission per my chart review. Medication reconciliation is pending.  Assessment: Patient is a 65 y/o M with medical history as above who presented to the ED 1/10 with respiratory distress in setting of known SARS-CoV-2 positivity. Troponin elevated to 606. Pharmacy has been consulted to initiate heparin infusion for suspected ACS.  Baseline CBC with Hgb 12.7, platelets within normal limits. Baseline aPTT 48s and INR 2.2. Suspect elevated in the setting of liver injury given transaminitis. INR can not be interpreted to reflect coagulability.   Goal of Therapy:  Heparin level 0.3-0.7 units/ml Monitor platelets by anticoagulation protocol: Yes   Plan:  --1/11 at 1212 HL = 0.14, subtherapeutic. Will order heparin 2000 unit IV bolus x 1 and increase heparin drip rate to 1400 units/hr --Will re-check HL 8 hours after rate change --Daily CBC per protocol while on heparin infusion; CBC for today still pending. Will need to  follow-up  Tressie Ellis 07/05/2020,1:09 PM
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
## 2020-07-05 NOTE — Progress Notes (Signed)
Dr. Thedore Mins notified that there are no more bags of 0-potassium available currently. Verbal order received, to use  2K bags.

## 2020-07-06 DIAGNOSIS — I214 Non-ST elevation (NSTEMI) myocardial infarction: Secondary | ICD-10-CM

## 2020-07-06 DIAGNOSIS — U071 COVID-19: Secondary | ICD-10-CM | POA: Diagnosis not present

## 2020-07-06 DIAGNOSIS — J80 Acute respiratory distress syndrome: Secondary | ICD-10-CM | POA: Diagnosis not present

## 2020-07-06 LAB — GLUCOSE, CAPILLARY
Glucose-Capillary: 153 mg/dL — ABNORMAL HIGH (ref 70–99)
Glucose-Capillary: 184 mg/dL — ABNORMAL HIGH (ref 70–99)
Glucose-Capillary: 206 mg/dL — ABNORMAL HIGH (ref 70–99)
Glucose-Capillary: 45 mg/dL — ABNORMAL LOW (ref 70–99)
Glucose-Capillary: 57 mg/dL — ABNORMAL LOW (ref 70–99)
Glucose-Capillary: 66 mg/dL — ABNORMAL LOW (ref 70–99)
Glucose-Capillary: 66 mg/dL — ABNORMAL LOW (ref 70–99)
Glucose-Capillary: 81 mg/dL (ref 70–99)
Glucose-Capillary: 81 mg/dL (ref 70–99)
Glucose-Capillary: 88 mg/dL (ref 70–99)
Glucose-Capillary: 94 mg/dL (ref 70–99)
Glucose-Capillary: 96 mg/dL (ref 70–99)

## 2020-07-06 LAB — CBC WITH DIFFERENTIAL/PLATELET
Abs Immature Granulocytes: 1.81 10*3/uL — ABNORMAL HIGH (ref 0.00–0.07)
Basophils Absolute: 0.1 10*3/uL (ref 0.0–0.1)
Basophils Relative: 0 %
Eosinophils Absolute: 0 10*3/uL (ref 0.0–0.5)
Eosinophils Relative: 0 %
HCT: 29 % — ABNORMAL LOW (ref 39.0–52.0)
Hemoglobin: 9.3 g/dL — ABNORMAL LOW (ref 13.0–17.0)
Immature Granulocytes: 7 %
Lymphocytes Relative: 3 %
Lymphs Abs: 0.8 10*3/uL (ref 0.7–4.0)
MCH: 27.8 pg (ref 26.0–34.0)
MCHC: 32.1 g/dL (ref 30.0–36.0)
MCV: 86.6 fL (ref 80.0–100.0)
Monocytes Absolute: 0.3 10*3/uL (ref 0.1–1.0)
Monocytes Relative: 1 %
Neutro Abs: 22 10*3/uL — ABNORMAL HIGH (ref 1.7–7.7)
Neutrophils Relative %: 89 %
Platelets: 53 10*3/uL — ABNORMAL LOW (ref 150–400)
RBC: 3.35 MIL/uL — ABNORMAL LOW (ref 4.22–5.81)
RDW: 17 % — ABNORMAL HIGH (ref 11.5–15.5)
Smear Review: DECREASED
WBC: 24.9 10*3/uL — ABNORMAL HIGH (ref 4.0–10.5)
nRBC: 3.8 % — ABNORMAL HIGH (ref 0.0–0.2)

## 2020-07-06 LAB — PROCALCITONIN: Procalcitonin: 21.73 ng/mL

## 2020-07-06 LAB — LACTIC ACID, PLASMA
Lactic Acid, Venous: 10.4 mmol/L (ref 0.5–1.9)
Lactic Acid, Venous: 9.3 mmol/L (ref 0.5–1.9)

## 2020-07-06 LAB — BLOOD GAS, ARTERIAL
Acid-base deficit: 3.5 mmol/L — ABNORMAL HIGH (ref 0.0–2.0)
Bicarbonate: 24.4 mmol/L (ref 20.0–28.0)
FIO2: 100
MECHVT: 480 mL
O2 Saturation: 99 %
PEEP: 15 cmH2O
Patient temperature: 37
RATE: 30 resp/min
pCO2 arterial: 57 mmHg — ABNORMAL HIGH (ref 32.0–48.0)
pH, Arterial: 7.24 — ABNORMAL LOW (ref 7.350–7.450)
pO2, Arterial: 153 mmHg — ABNORMAL HIGH (ref 83.0–108.0)

## 2020-07-06 LAB — COMPREHENSIVE METABOLIC PANEL
ALT: 5626 U/L — ABNORMAL HIGH (ref 0–44)
AST: >10000 U/L — ABNORMAL HIGH (ref 15–41)
Albumin: 1.9 g/dL — ABNORMAL LOW (ref 3.5–5.0)
Alkaline Phosphatase: 316 U/L — ABNORMAL HIGH (ref 38–126)
Anion gap: 21 — ABNORMAL HIGH (ref 5–15)
BUN: 21 mg/dL (ref 8–23)
CO2: 23 mmol/L (ref 22–32)
Calcium: 5.8 mg/dL — CL (ref 8.9–10.3)
Chloride: 93 mmol/L — ABNORMAL LOW (ref 98–111)
Creatinine, Ser: 2.73 mg/dL — ABNORMAL HIGH (ref 0.61–1.24)
GFR, Estimated: 25 mL/min — ABNORMAL LOW (ref >60)
Glucose, Bld: 114 mg/dL — ABNORMAL HIGH (ref 70–99)
Potassium: 4.7 mmol/L (ref 3.5–5.1)
Sodium: 137 mmol/L (ref 135–145)
Total Bilirubin: 3.6 mg/dL — ABNORMAL HIGH (ref 0.3–1.2)
Total Protein: 5 g/dL — ABNORMAL LOW (ref 6.5–8.1)

## 2020-07-06 LAB — C-REACTIVE PROTEIN: CRP: 27.1 mg/dL — ABNORMAL HIGH (ref <1.0)

## 2020-07-06 LAB — CK: Total CK: 863 U/L — ABNORMAL HIGH (ref 49–397)

## 2020-07-06 LAB — PROTIME-INR
INR: 4.9 (ref 0.8–1.2)
Prothrombin Time: 44.4 seconds — ABNORMAL HIGH (ref 11.4–15.2)

## 2020-07-06 LAB — TRIGLYCERIDES: Triglycerides: 352 mg/dL — ABNORMAL HIGH (ref <150)

## 2020-07-06 LAB — D-DIMER, QUANTITATIVE: D-Dimer, Quant: >20 ug/mL-FEU — ABNORMAL HIGH (ref 0.00–0.50)

## 2020-07-06 LAB — CALCIUM: Calcium: 5.5 mg/dL — CL (ref 8.9–10.3)

## 2020-07-06 LAB — MAGNESIUM: Magnesium: 2.5 mg/dL — ABNORMAL HIGH (ref 1.7–2.4)

## 2020-07-06 LAB — FERRITIN: Ferritin: 753 ng/mL — ABNORMAL HIGH (ref 24–336)

## 2020-07-06 LAB — PATHOLOGIST SMEAR REVIEW

## 2020-07-06 MED ORDER — LORAZEPAM 2 MG/ML IJ SOLN
1.0000 mg | INTRAMUSCULAR | Status: DC | PRN
  Administered 2020-07-06: 2 mg via INTRAVENOUS
  Filled 2020-07-06: qty 1

## 2020-07-06 MED ORDER — DEXTROSE 10 % IV SOLN: INTRAVENOUS | Status: DC

## 2020-07-06 MED ORDER — DEXTROSE 50 % IV SOLN
1.0000 | Freq: Once | INTRAVENOUS | Status: AC
  Administered 2020-07-06: 50 mL via INTRAVENOUS
  Filled 2020-07-06: qty 50

## 2020-07-06 MED ORDER — MORPHINE 100MG IN NS 100ML (1MG/ML) PREMIX INFUSION
1.0000 mg/h | INTRAVENOUS | Status: DC
  Administered 2020-07-06: 5 mg/h via INTRAVENOUS
  Filled 2020-07-06: qty 100

## 2020-07-06 MED ORDER — HALOPERIDOL LACTATE 5 MG/ML IJ SOLN
0.5000 mg | INTRAMUSCULAR | Status: DC | PRN
  Administered 2020-07-06: 0.5 mg via INTRAVENOUS
  Filled 2020-07-06: qty 1

## 2020-07-06 MED ORDER — PUREFLOW DIALYSIS SOLUTION: INTRAVENOUS | Status: DC

## 2020-07-06 MED ORDER — MIDAZOLAM 50MG/50ML (1MG/ML) PREMIX INFUSION
0.5000 mg/h | INTRAVENOUS | Status: DC
  Administered 2020-07-06: 0.5 mg/h via INTRAVENOUS
  Filled 2020-07-06 (×2): qty 50

## 2020-07-06 MED ORDER — DEXTROSE 50 % IV SOLN: 25.0000 mL | Freq: Once | INTRAVENOUS | Status: AC

## 2020-07-06 MED ORDER — PIPERACILLIN-TAZOBACTAM 3.375 G IVPB 30 MIN
3.3750 g | Freq: Four times a day (QID) | INTRAVENOUS | Status: DC
  Administered 2020-07-06: 3.375 g via INTRAVENOUS
  Filled 2020-07-06 (×8): qty 50

## 2020-07-06 MED ORDER — DEXTROSE 50 % IV SOLN
INTRAVENOUS | Status: AC
  Administered 2020-07-06: 25 mL via INTRAVENOUS
  Filled 2020-07-06: qty 50

## 2020-07-06 MED ORDER — MORPHINE BOLUS VIA INFUSION
2.0000 mg | INTRAVENOUS | Status: DC | PRN
  Filled 2020-07-06: qty 4

## 2020-07-06 MED ORDER — GLYCOPYRROLATE 0.2 MG/ML IJ SOLN
0.2000 mg | INTRAMUSCULAR | Status: DC | PRN
  Administered 2020-07-06: 0.2 mg via INTRAVENOUS
  Filled 2020-07-06: qty 1

## 2020-07-06 MED ORDER — DEXTROSE 50 % IV SOLN
25.0000 mL | Freq: Once | INTRAVENOUS | Status: AC
  Administered 2020-07-06: 25 mL via INTRAVENOUS
  Filled 2020-07-06: qty 50

## 2020-07-06 MED ORDER — CALCIUM GLUCONATE-NACL 1-0.675 GM/50ML-% IV SOLN
1.0000 g | Freq: Once | INTRAVENOUS | Status: AC
  Administered 2020-07-06: 1000 mg via INTRAVENOUS
  Filled 2020-07-06: qty 50

## 2020-07-07 LAB — CALCIUM, IONIZED: Calcium, Ionized, Serum: <3 mg/dL — ABNORMAL LOW (ref 4.5–5.6)

## 2020-07-09 LAB — CULTURE, BLOOD (SINGLE)
Culture: NO GROWTH
Culture: NO GROWTH

## 2020-07-26 NOTE — Progress Notes (Signed)
NP notified of critical lactic acid result of 10.4. Acknowledged, no new orders at this time.

## 2020-07-26 NOTE — Progress Notes (Signed)
Progress Note  Due to the COVID-19 pandemic, this visit was completed with telemedicine (audio/video) technology to reduce patient and provider exposure as well as to preserve personal protective equipment.   Patient Name: Derek Townsend Date of Encounter: 07-08-2020  Primary Cardiologist: New- Dr. Saunders Revel  Subjective   Intubated, sedated.  Telemetry showing sinus rhythm.  Diagnosed with ARDS, COVID-pneumonia.  Inpatient Medications    Scheduled Meds: . chlorhexidine gluconate (MEDLINE KIT)  15 mL Mouth Rinse BID  . Chlorhexidine Gluconate Cloth  6 each Topical Daily  . fentaNYL (SUBLIMAZE) injection  25 mcg Intravenous Once  . hydrocortisone sod succinate (SOLU-CORTEF) inj  50 mg Intravenous Q6H  . mouth rinse  15 mL Mouth Rinse 10 times per day  . sodium chloride flush  3 mL Intravenous Q12H   Continuous Infusions: . sodium chloride Stopped (08-Jul-2020 1156)  . dextrose 50 mL/hr at Jul 08, 2020 1200  . epinephrine Stopped (07/05/20 2105)  . famotidine (PEPCID) IV 100 mL/hr at 07-08-20 1200  . fentaNYL infusion INTRAVENOUS 175 mcg/hr (2020-07-08 1200)  . midazolam 0.5 mg/hr (July 08, 2020 1200)  . norepinephrine (LEVOPHED) Adult infusion 28 mcg/min (08-Jul-2020 1200)  . phenylephrine (NEO-SYNEPHRINE) Adult infusion Stopped (08-Jul-2020 0657)  . piperacillin-tazobactam 3.375 g (07-08-20 1212)  . prismasol BGK 0/2.5 Stopped (07/05/20 1739)  . prismasol BGK 0/2.5 Stopped (07/05/20 1739)  . prismasol BGK 0/2.5 2,000 mL/hr at 07/08/2020 0030  . propofol (DIPRIVAN) infusion Stopped (07-08-20 0950)  . vasopressin 0.04 Units/min (2020/07/08 1200)   PRN Meds: sodium chloride, fentaNYL, heparin, ondansetron (ZOFRAN) IV, sodium chloride, sodium chloride flush, vecuronium   Vital Signs    Vitals:   07-08-20 0900 07/08/20 1000 07-08-20 1100 07/08/20 1120  BP: (!) 106/35 (!) 89/36 (!) 103/46   Pulse: 90 94 (!) 102 (!) 102  Resp: (!) 0 (!) 30 (!) 30 (!) 30  Temp: (!) 95.36 F (35.2 C) (!) 96.26 F  (35.7 C) (!) 97.52 F (36.4 C) 97.88 F (36.6 C)  TempSrc:      SpO2: 100% 100% 100% 100%  Weight:      Height:        Intake/Output Summary (Last 24 hours) at 2020/07/08 1213 Last data filed at 07/08/2020 1200 Gross per 24 hour  Intake 5892.56 ml  Output 1284 ml  Net 4608.56 ml   Last 3 Weights 07/19/2020 11/03/2015 11/03/2015  Weight (lbs) 182 lb 15.7 oz 182 lb 15.7 oz 183 lb  Weight (kg) 83 kg 83 kg 83.008 kg      Telemetry    Sinus rhythm- Personally Reviewed  ECG    No new tracing- Personally Reviewed  Physical Exam   VITAL SIGNS:  reviewed GEN:  Intubated, sedated  Labs    Chemistry Recent Labs  Lab 07/01/2020 1041 07/05/20 0039 07/05/20 0909 07/05/20 1720 08-Jul-2020 0149 07-08-20 0500  NA 129* 128*  --  136  --  137  K 4.1 6.7*  --  5.4*  --  4.7  CL 90* 93*  --  87*  --  93*  CO2 13* 15*  --  23  --  23  GLUCOSE 173* 212*  --  89  --  114*  BUN 17 21  --  27*  --  21  CREATININE 1.51* 2.96*  --  3.88*  --  2.73*  CALCIUM 8.4* 7.0*  --  5.0* 5.5* 5.8*  PROT 7.7  --  5.2*  --   --  5.0*  ALBUMIN 2.7*  --  1.8* 2.0*  --  1.9*  AST 480*  --  >10,000*  --   --  >10,000*  ALT 317*  --  4,943*  --   --  5,626*  ALKPHOS 111  --  190*  --   --  316*  BILITOT 1.9*  --  3.2*  --   --  3.6*  GFRNONAA 51* 23*  --  16*  --  25*  ANIONGAP 26* 20*  --  26*  --  21*     Hematology Recent Labs  Lab 07/05/20 1212 07/05/20 1412 July 25, 2020 1048  WBC 36.7* 37.1* 24.9*  RBC 4.35 4.15* 3.35*  HGB 12.3* 11.6* 9.3*  HCT 37.4* 35.5* 29.0*  MCV 86.0 85.5 86.6  MCH 28.3 28.0 27.8  MCHC 32.9 32.7 32.1  RDW 16.8* 16.6* 17.0*  PLT 59* 66* 53*    Cardiac EnzymesNo results for input(s): TROPONINI in the last 168 hours. No results for input(s): TROPIPOC in the last 168 hours.   BNP Recent Labs  Lab 07/11/2020 1041  BNP 240.2*     DDimer  Recent Labs  Lab 07/05/20 0909  DDIMER >20.00*     Radiology    DG Chest 1 View  Result Date: 07/02/2020 CLINICAL DATA:   Respiratory distress, positive COVID test 10 days ago, post intubation. EXAM: CHEST  1 VIEW COMPARISON:  Earlier the same day at 1052 hours. FINDINGS: Endotracheal tube terminates 1.4 cm above the carina. Heart size stable. Worsening patchy bilateral airspace opacification with low lung volumes. No definite pleural fluid. IMPRESSION: 1. Endotracheal tube is low lying. Retracting 3-4 cm would better position the tip above the carina. 2. Worsening bilateral airspace opacification, indicative of COVID-19 pneumonia. Electronically Signed   By: Lorin Picket M.D.   On: 07/10/2020 13:15   DG Abdomen 1 View  Result Date: 06/30/2020 CLINICAL DATA:  Nasogastric tube placement. EXAM: ABDOMEN - 1 VIEW COMPARISON:  None. FINDINGS: The bowel gas pattern is normal. Distal tip of nasogastric tube is seen in the proximal stomach. No radio-opaque calculi or other significant radiographic abnormality are seen. IMPRESSION: Distal tip of nasogastric tube seen in proximal stomach. Electronically Signed   By: Marijo Conception M.D.   On: 06/26/2020 14:06   DG Chest Port 1 View  Result Date: 07/05/2020 CLINICAL DATA:  Central line placement. EXAM: PORTABLE CHEST 1 VIEW COMPARISON:  July 04, 2020. FINDINGS: The heart size and mediastinal contours are within normal limits. Endotracheal nasogastric tubes are unchanged in position. Right internal jugular catheter is unchanged. Interval placement of left internal jugular catheter with distal tip in expected position of the SVC. No pneumothorax or pleural effusion is noted. Bilateral patchy airspace opacities are again noted consistent with multifocal pneumonia. The visualized skeletal structures are unremarkable. IMPRESSION: Interval placement of left internal jugular catheter with distal tip in expected position of the SVC. No pneumothorax is noted. Stable bilateral patchy airspace opacities consistent with multifocal pneumonia. Electronically Signed   By: Marijo Conception M.D.    On: 07/05/2020 13:57   DG Chest Port 1 View  Result Date: 07/05/2020 CLINICAL DATA:  Central line placement EXAM: PORTABLE CHEST 1 VIEW COMPARISON:  06/30/2020 FINDINGS: Endotracheal tube 3.5 cm above the carina. Right central line tip in the SVC. No pneumothorax. NG tube is in the stomach. Bilateral airspace disease, right greater than left, unchanged. Heart is normal size. IMPRESSION: Right central line tip in the SVC.  No pneumothorax. Other support devices as above. Stable bilateral airspace disease. Electronically Signed  By: Rolm Baptise M.D.   On: 07/05/2020 00:17   ECHOCARDIOGRAM COMPLETE  Result Date: 07/05/2020    ECHOCARDIOGRAM REPORT   Patient Name:   Derek Townsend Date of Exam: 07/05/2020 Medical Rec #:  [id number]         Height:       65.0 in Accession #:    [id number]        Weight:       183.0 lb Date of Birth:  September 27, 1955          BSA:          1.905 m Patient Age:    58 years          BP:           87/31 mmHg Patient Gender: M                 HR:           103 bpm. Exam Location:  [facility] Procedure: 2D Echo, Color Doppler and Cardiac Doppler Indications:     Elevated troponin  History:         Patient has no prior history of Echocardiogram examinations.                  Risk Factors:Hypertension and Diabetes. Pt tested positive for                  COVID-19 a week ago from 07/05/20.  Sonographer:     Charmayne Sheer RDCS (AE) Referring Phys:  [id number] Bradly Bienenstock Diagnosing Phys: Nelva Bush MD  Sonographer Comments: Suboptimal apical window and echo performed with patient supine and on artificial respirator. IMPRESSIONS  1. Left ventricular ejection fraction, by estimation, is 65 to 70%. The left ventricle has normal function. Left ventricular endocardial border not optimally defined to evaluate regional wall motion. Left ventricular diastolic parameters were normal.  2. Right ventricular systolic function is moderately reduced. The right ventricular size is moderately enlarged.  3.  Right atrial size was mildly dilated.  4. The mitral valve is grossly normal. No evidence of mitral valve regurgitation. No evidence of mitral stenosis.  5. The aortic valve has an indeterminant number of cusps. Aortic valve regurgitation is not visualized. No aortic stenosis is present. Conclusion(s)/Recommendation(s): Hyperdynamic left ventricular contraction with dilated right ventricle and severe hypokinesis of the mid RV free wall. Findings are concerning for acute cor pulmonale as can be see with pulmonary embolism (McConnell's sign). Findings were conveyed to Dr. Mortimer Fries at 1:00 PM on 07/05/2020. FINDINGS  Left Ventricle: Left ventricular ejection fraction, by estimation, is 65 to 70%. The left ventricle has normal function. Left ventricular endocardial border not optimally defined to evaluate regional wall motion. The left ventricular internal cavity size was normal in size. There is no left ventricular hypertrophy. Left ventricular diastolic parameters were normal. Right Ventricle: There is marked hypokinesis of the mid freewall of the right ventricle. The right ventricular size is moderately enlarged. No increase in right ventricular wall thickness. Right ventricular systolic function is moderately reduced. Left Atrium: Left atrial size was normal in size. Right Atrium: Right atrial size was mildly dilated. Pericardium: There is no evidence of pericardial effusion. Mitral Valve: The mitral valve is grossly normal. No evidence of mitral valve regurgitation. No evidence of mitral valve stenosis. MV peak gradient, 1.9 mmHg. The mean mitral valve gradient is 1.0 mmHg. Tricuspid Valve: The tricuspid valve is grossly normal. Tricuspid valve regurgitation is trivial. Aortic Valve:  The aortic valve has an indeterminant number of cusps. Aortic valve regurgitation is not visualized. No aortic stenosis is present. Aortic valve mean gradient measures 4.0 mmHg. Aortic valve peak gradient measures 8.8 mmHg. Aortic valve  area, by VTI measures 3.35 cm. Pulmonic Valve: The pulmonic valve was not well visualized. Pulmonic valve regurgitation is not visualized. No evidence of pulmonic stenosis. Aorta: The aortic root is normal in size and structure. Pulmonary Artery: The pulmonary artery is not well seen. IAS/Shunts: The interatrial septum was not well visualized.  LEFT VENTRICLE PLAX 2D LVIDd:         4.13 cm  Diastology LVIDs:         2.18 cm  LV e' medial:    7.51 cm/s LV PW:         0.90 cm  LV E/e' medial:  7.2 LV IVS:        0.74 cm  LV e' lateral:   10.60 cm/s LVOT diam:     2.20 cm  LV E/e' lateral: 5.1 LV SV:         50 LV SV Index:   26 LVOT Area:     3.80 cm  RIGHT VENTRICLE RV Basal diam:  3.49 cm RV Mid diam:    4.95 cm TAPSE (M-mode): 1.2 cm LEFT ATRIUM           Index       RIGHT ATRIUM           Index LA diam:      3.90 cm 2.05 cm/m  RA Area:     21.60 cm LA Vol (A4C): 34.7 ml 18.22 ml/m RA Volume:   67.20 ml  35.28 ml/m  AORTIC VALVE                   PULMONIC VALVE AV Area (Vmax):    3.03 cm    PV Vmax:       0.90 m/s AV Area (Vmean):   2.84 cm    PV Vmean:      58.700 cm/s AV Area (VTI):     3.35 cm    PV VTI:        0.105 m AV Vmax:           148.00 cm/s PV Peak grad:  3.3 mmHg AV Vmean:          93.700 cm/s PV Mean grad:  2.0 mmHg AV VTI:            0.150 m AV Peak Grad:      8.8 mmHg AV Mean Grad:      4.0 mmHg LVOT Vmax:         118.00 cm/s LVOT Vmean:        70.000 cm/s LVOT VTI:          0.132 m LVOT/AV VTI ratio: 0.88  AORTA Ao Root diam: 3.50 cm MITRAL VALVE               TRICUSPID VALVE MV Area (PHT): 5.46 cm    TR Peak grad:   16.6 mmHg MV Area VTI:   3.37 cm    TR Vmax:        204.00 cm/s MV Peak grad:  1.9 mmHg MV Mean grad:  1.0 mmHg    SHUNTS MV Vmax:       0.68 m/s    Systemic VTI:  0.13 m MV Vmean:      42.8 cm/s   Systemic Diam:  2.20 cm MV Decel Time: 139 msec MV E velocity: 54.00 cm/s MV A velocity: 41.60 cm/s MV E/A ratio:  1.30 Nelva Bush MD Electronically signed by Nelva Bush  MD Signature Date/Time: 07/05/2020/5:18:43 PM    Final    US Abdomen Limited RUQ (LIVER/GB)  Result Date: 07/12/2020 CLINICAL DATA:  Hyperbilirubinemia. EXAM: ULTRASOUND ABDOMEN LIMITED RIGHT UPPER QUADRANT COMPARISON:  None. FINDINGS: Gallbladder: Diffuse gallbladder wall thickening with pericholecystic fluid. No gallstones or gallbladder dilation. Sonographic Murphy sign not evaluated by sonographer as patient is intubated. Common bile duct: Diameter: 2.9 mm Liver: No focal lesion identified. Diffusely increased echogenicity. Portal vein is patent on color Doppler imaging with normal direction of blood flow towards the liver. Other: None. IMPRESSION: 1. Peri cholecystic fluid with diffuse gallbladder wall thickening without gallstones or gallbladder/common bile duct dilation. Findings which are favored to be sequela of hepatic dysfunction. If concern for cholecystitis recommend nuclear medicine HIDA scan. 2. The echogenicity of the liver is increased. This is a nonspecific finding but is most commonly seen with fatty infiltration. There are no obvious focal liver lesions. Electronically Signed   By: Dahlia Bailiff MD   On: 07/08/2020 17:22    Cardiac Studies   07/05/2020 1. Left ventricular ejection fraction, by estimation, is 65 to 70%. The  left ventricle has normal function. Left ventricular endocardial border  not optimally defined to evaluate regional wall motion. Left ventricular  diastolic parameters were normal.  2. Right ventricular systolic function is moderately reduced. The right  ventricular size is moderately enlarged.  3. Right atrial size was mildly dilated.  4. The mitral valve is grossly normal. No evidence of mitral valve  regurgitation. No evidence of mitral stenosis.  5. The aortic valve has an indeterminant number of cusps. Aortic valve  regurgitation is not visualized. No aortic stenosis is present.   Patient Profile     65 y.o. male with history of hypertension,  diabetes presenting with shortness of breath, diagnosed with respiratory failure secondary to COVID-pneumonia.  Being seen for elevated troponins.   Assessment & Plan    1.  Elevated troponins -In the setting of shock, severe hypoxia -Status post 48 hours of heparin -Echocardiogram with preserved ejection fraction, EF 65% -Continue aspirin, monitor clinical status. -Patient too unstable for heart cath due to multiorgan failure.  Avoid statins in light of elevated LFTs.  2.  COVID-19, respiratory failure, multiorgan failure -Management as per critical care team. -Supportive care per both nephrology and CCM.  Total encounter time 35 minutes  Greater than 50% was spent in counseling and coordination of care with the patient       Signed, Kate Sable, MD  07/20/2020, 12:13 PM

## 2020-07-26 NOTE — Progress Notes (Signed)
Several visits w/this family today as they gathered in ICU waiting room to be present in pt.'s rm.  Pt.'s wife and mother-in-law requested prayer during initial morning visit.  CH and CH Earl Lites checked in repeatedly w/family throughout the day as they waited for news from medical staff.  CH remains available as needed.

## 2020-07-26 NOTE — Progress Notes (Signed)
GOALS OF CARE DISCUSSION  The Clinical status was relayed to family in detail. Wife and all children and family  Updated and notified of patients medical condition.  Patient remains unresponsive and will not open eyes to command.   Upon assessment his breath sounds are course crackles with significant secretions to oral pharyngeal region.  Nasopharyngeal suction produced copious sanguineous secretions.    Patient is having a weak cough and struggling to remove secretions.   patient with increased WOB and using accessory muscles to breathe Explained to family course of therapy and the modalities     Patient with Progressive multiorgan failure with very low chance of meaningful recovery despite all aggressive and optimal medical therapy. Patient is in the Dying  Process associated with Suffering.  Family understands the situation.  They have consented and agreed to DNR/DNI and would like to proceed with Comfort care measures.  Family are satisfied with Plan of action and management. All questions answered  Additional CC time 32 mins   Kurian Santiago Glad, M.D.  [facility]  Medical Director [facility] Medical Director [facility] Cardio-Pulmonary Department

## 2020-07-26 NOTE — Progress Notes (Signed)
NP notified of critical calcium result of 5.5, verbal order received for 1gm Calcium gluconate IVPB, CMP and trend lactic acid q 4hours.

## 2020-07-26 NOTE — Progress Notes (Signed)
[address] [place], [place] [address] Phone [phone]. Fax [phone]  Date: July 08, 2020                  Patient Name:  Derek Townsend   [id number]  DOB: 09/14/1955   65 y.o., male    Presentation: Patient is a 65 y.o. male admitted to [facility] on 06/30/2020 for evaluation of ARDS (adult respiratory distress syndrome) ([place]) [J80] Bilirubinemia [E80.6] Respiratory distress [R06.03] Hypoxia [R09.02] Encounter for central line placement [Z45.2] NSTEMI (non-ST elevated myocardial infarction) ([place]) [I21.4] COVID-19 [U07.1]   Nephrology consult for acute kidney injury.  Presenting creatinine of 1.51 on January 10 which has rapidly worsened to 2.96.  Patient was hypotensive and was intubated in the emergency room on January 11. Patient was also found to be severely hyperkalemic with potassium of 6.7 AST, ALT are critically elevated. Patient requiring multiple pressors CRRT started 07/05/2020  Hospital course. 07/07/19 Remain critically ill, Resp: vent assisted. Fio2 100 %,Neuro:sedated. Propofol, fentanyl,Cardiac: nor epi, vasopressin, GI: OGT clamped Renal:  01/11 0701 - 01/12 0700 In: 7566.5 [I.V.:6714.7; IV Piggyback:750.8] Out: 995   CRRT ongoing Bath changed to 2 K last night dialyzer clotted this morning-change to oxiris dialyzer  Vital Signs: Blood pressure (!) 106/35, pulse 90, temperature (!) 95.36 F (35.2 C), resp. rate (!) 0, height _0  (1.651 m), weight 83 kg, SpO2 100 %.   Intake/Output Summary (Last 24 hours) at 2020/07/08 0920 Last data filed at July 08, 2020 0900 Gross per 24 hour  Intake 8026.9 ml  Output 1246 ml  Net 6780.9 ml    Weight trends: [employer]   07/08/2020 1038  Weight: 83 kg    Physical Exam: General:  Critically ill-appearing  HEENT  ET tube in place, conjunctival edema, scleral icterus  Lungs:  Ventilator assisted  Heart::  Tachycardic  Abdomen:  Soft, nondistended  Extremities:  Trace edema  Neurologic:  Sedated   Skin:  Feet and hands cool to touch  Access: Left IJ   Foley:  In place       Lab results: Basic Metabolic Panel: Recent Labs  Lab 07/09/2020 1041 07/05/20 0039 07/05/20 1720 07/08/20 0149 Jul 08, 2020 0500  NA 129* 128* 136  --  137  K 4.1 6.7* 5.4*  --  4.7  CL 90* 93* 87*  --  93*  CO2 13* 15* 23  --  23  GLUCOSE 173* 212* 89  --  114*  BUN 17 21 27*  --  21  CREATININE 1.51* 2.96* 3.88*  --  2.73*  CALCIUM 8.4* 7.0* 5.0* 5.5* 5.8*  MG 2.8*  --   --   --  2.5*  PHOS  --   --  12.4*  --   --     Liver Function Tests: Recent Labs  Lab 2020-07-08 0500  AST >10,000*  ALT 5,626*  ALKPHOS 316*  BILITOT 3.6*  PROT 5.0*  ALBUMIN 1.9*   No results for input(s): LIPASE, AMYLASE in the last 168 hours. No results for input(s): AMMONIA in the last 168 hours.  CBC: Recent Labs  Lab 07/25/2020 1041 07/05/20 1212 07/05/20 1412  WBC 21.5* 36.7* 37.1*  NEUTROABS 17.0*  --   --   HGB 12.7* 12.3* 11.6*  HCT 37.5* 37.4* 35.5*  MCV 82.8 86.0 85.5  PLT 208 59* 66*    Cardiac Enzymes: Recent Labs  Lab July 08, 2020 0500  CKTOTAL 863*    BNP: Invalid input(s): POCBNP  CBG: Recent Labs  Lab 2020/07/08 0500 07/08/2020  2637 07/18/2020 0658 18-Jul-2020 0804 07/18/20 0916  GLUCAP 66* 88 94 57* 76*    Microbiology: Recent Results (from the past 720 hour(s))  Culture, blood (single)     Status: None (Preliminary result)   Collection Time: 07/17/2020 10:41 AM   Specimen: BLOOD  Result Value Ref Range Status   Specimen Description BLOOD RIGHT [facility]  Final   Special Requests   Final    BOTTLES DRAWN AEROBIC AND ANAEROBIC Blood Culture results may not be optimal due to an inadequate volume of blood received in culture bottles   Culture   Final    NO GROWTH 2 DAYS Performed at [facility], [address], [place], [place] [address]    Report Status PENDING  Incomplete  Blood culture (single)     Status: None (Preliminary result)   Collection Time: 07/10/2020 10:41 AM    Specimen: BLOOD  Result Value Ref Range Status   Specimen Description BLOOD BLOOD RIGHT HAND  Final   Special Requests   Final    BOTTLES DRAWN AEROBIC AND ANAEROBIC Blood Culture results may not be optimal due to an inadequate volume of blood received in culture bottles   Culture   Final    NO GROWTH 2 DAYS Performed at [facility], [address], [place], [place] [address]    Report Status PENDING  Incomplete  MRSA PCR Screening     Status: None   Collection Time: 07/05/20 12:22 PM   Specimen: Nasopharyngeal  Result Value Ref Range Status   MRSA by PCR NEGATIVE NEGATIVE Final    Comment:        The GeneXpert MRSA Assay (FDA approved for NASAL specimens only), is one component of a comprehensive MRSA colonization surveillance program. It is not intended to diagnose MRSA infection nor to guide or monitor treatment for MRSA infections. Performed at [facility], [address] [place], [place] [address]      Coagulation Studies: Recent Labs    07/12/2020 1216 07/05/20 1720 07/18/2020 0500  LABPROT 23.4* 39.0* 44.4*  INR 2.2* 4.2* 4.9*    Urinalysis: No results for input(s): COLORURINE, LABSPEC, PHURINE, GLUCOSEU, HGBUR, BILIRUBINUR, KETONESUR, PROTEINUR, UROBILINOGEN, NITRITE, LEUKOCYTESUR in the last 72 hours.  Invalid input(s): APPERANCEUR      Imaging: DG Chest 1 View  Result Date: 07/01/2020 CLINICAL DATA:  Respiratory distress, positive COVID test 10 days ago, post intubation. EXAM: CHEST  1 VIEW COMPARISON:  Earlier the same day at 1052 hours. FINDINGS: Endotracheal tube terminates 1.4 cm above the carina. Heart size stable. Worsening patchy bilateral airspace opacification with low lung volumes. No definite pleural fluid. IMPRESSION: 1. Endotracheal tube is low lying. Retracting 3-4 cm would better position the tip above the carina. 2. Worsening bilateral airspace opacification, indicative of COVID-19 pneumonia. Electronically Signed   By:  Lorin Picket M.D.   On: 07/23/2020 13:15   DG Abdomen 1 View  Result Date: 07/11/2020 CLINICAL DATA:  Nasogastric tube placement. EXAM: ABDOMEN - 1 VIEW COMPARISON:  None. FINDINGS: The bowel gas pattern is normal. Distal tip of nasogastric tube is seen in the proximal stomach. No radio-opaque calculi or other significant radiographic abnormality are seen. IMPRESSION: Distal tip of nasogastric tube seen in proximal stomach. Electronically Signed   By: Marijo Conception M.D.   On: 07/12/2020 14:06   DG Chest Port 1 View  Result Date: 07/05/2020 CLINICAL DATA:  Central line placement. EXAM: PORTABLE CHEST 1 VIEW COMPARISON:  July 04, 2020. FINDINGS: The  heart size and mediastinal contours are within normal limits. Endotracheal nasogastric tubes are unchanged in position. Right internal jugular catheter is unchanged. Interval placement of left internal jugular catheter with distal tip in expected position of the SVC. No pneumothorax or pleural effusion is noted. Bilateral patchy airspace opacities are again noted consistent with multifocal pneumonia. The visualized skeletal structures are unremarkable. IMPRESSION: Interval placement of left internal jugular catheter with distal tip in expected position of the SVC. No pneumothorax is noted. Stable bilateral patchy airspace opacities consistent with multifocal pneumonia. Electronically Signed   By: Marijo Conception M.D.   On: 07/05/2020 13:57   DG Chest Port 1 View  Result Date: 07/05/2020 CLINICAL DATA:  Central line placement EXAM: PORTABLE CHEST 1 VIEW COMPARISON:  06/27/2020 FINDINGS: Endotracheal tube 3.5 cm above the carina. Right central line tip in the SVC. No pneumothorax. NG tube is in the stomach. Bilateral airspace disease, right greater than left, unchanged. Heart is normal size. IMPRESSION: Right central line tip in the SVC.  No pneumothorax. Other support devices as above. Stable bilateral airspace disease. Electronically Signed   By: Rolm Baptise M.D.   On: 07/05/2020 00:17   DG Chest Portable 1 View  Result Date: 06/30/2020 CLINICAL DATA:  Respiratory distress.  Hypoxia.  COVID pneumonia. EXAM: PORTABLE CHEST 1 VIEW COMPARISON:  None. FINDINGS: Mildly low lung volumes with indistinct pulmonary vasculature and hazy interstitial and patchy airspace opacities in both lungs. Borderline enlargement of the cardiopericardial silhouette. No blunting of the costophrenic angles. Thoracic spondylosis. Prominent upper mediastinum is nonspecific and may be related to portable AP technique. IMPRESSION: 1. Bilateral interstitial and patchy airspace opacities in both lungs, compatible with atypical pneumonia or edema. 2. Borderline enlargement of the cardiopericardial silhouette. 3. Thoracic spondylosis. Electronically Signed   By: Van Clines M.D.   On: 07/03/2020 11:14   ECHOCARDIOGRAM COMPLETE  Result Date: 07/05/2020    ECHOCARDIOGRAM REPORT   Patient Name:   JAYLENN ALTIER Date of Exam: 07/05/2020 Medical Rec #:  [id number]         Height:       65.0 in Accession #:    [id number]        Weight:       183.0 lb Date of Birth:  10/13/1955          BSA:          1.905 m Patient Age:    35 years          BP:           87/31 mmHg Patient Gender: M                 HR:           103 bpm. Exam Location:  [facility] Procedure: 2D Echo, Color Doppler and Cardiac Doppler Indications:     Elevated troponin  History:         Patient has no prior history of Echocardiogram examinations.                  Risk Factors:Hypertension and Diabetes. Pt tested positive for                  COVID-19 a week ago from 07/05/20.  Sonographer:     Charmayne Sheer RDCS (AE) Referring Phys:  [id number] Bradly Bienenstock Diagnosing Phys: Nelva Bush MD  Sonographer Comments: Suboptimal apical window and echo performed with patient supine and on artificial respirator. IMPRESSIONS  1. Left ventricular ejection fraction, by estimation, is 65 to 70%. The left ventricle has normal function. Left  ventricular endocardial border not optimally defined to evaluate regional wall motion. Left ventricular diastolic parameters were normal.  2. Right ventricular systolic function is moderately reduced. The right ventricular size is moderately enlarged.  3. Right atrial size was mildly dilated.  4. The mitral valve is grossly normal. No evidence of mitral valve regurgitation. No evidence of mitral stenosis.  5. The aortic valve has an indeterminant number of cusps. Aortic valve regurgitation is not visualized. No aortic stenosis is present. Conclusion(s)/Recommendation(s): Hyperdynamic left ventricular contraction with dilated right ventricle and severe hypokinesis of the mid RV free wall. Findings are concerning for acute cor pulmonale as can be see with pulmonary embolism (McConnell's sign). Findings were conveyed to Dr. Mortimer Fries at 1:00 PM on 07/05/2020. FINDINGS  Left Ventricle: Left ventricular ejection fraction, by estimation, is 65 to 70%. The left ventricle has normal function. Left ventricular endocardial border not optimally defined to evaluate regional wall motion. The left ventricular internal cavity size was normal in size. There is no left ventricular hypertrophy. Left ventricular diastolic parameters were normal. Right Ventricle: There is marked hypokinesis of the mid freewall of the right ventricle. The right ventricular size is moderately enlarged. No increase in right ventricular wall thickness. Right ventricular systolic function is moderately reduced. Left Atrium: Left atrial size was normal in size. Right Atrium: Right atrial size was mildly dilated. Pericardium: There is no evidence of pericardial effusion. Mitral Valve: The mitral valve is grossly normal. No evidence of mitral valve regurgitation. No evidence of mitral valve stenosis. MV peak gradient, 1.9 mmHg. The mean mitral valve gradient is 1.0 mmHg. Tricuspid Valve: The tricuspid valve is grossly normal. Tricuspid valve regurgitation is  trivial. Aortic Valve: The aortic valve has an indeterminant number of cusps. Aortic valve regurgitation is not visualized. No aortic stenosis is present. Aortic valve mean gradient measures 4.0 mmHg. Aortic valve peak gradient measures 8.8 mmHg. Aortic valve area, by VTI measures 3.35 cm. Pulmonic Valve: The pulmonic valve was not well visualized. Pulmonic valve regurgitation is not visualized. No evidence of pulmonic stenosis. Aorta: The aortic root is normal in size and structure. Pulmonary Artery: The pulmonary artery is not well seen. IAS/Shunts: The interatrial septum was not well visualized.  LEFT VENTRICLE PLAX 2D LVIDd:         4.13 cm  Diastology LVIDs:         2.18 cm  LV e' medial:    7.51 cm/s LV PW:         0.90 cm  LV E/e' medial:  7.2 LV IVS:        0.74 cm  LV e' lateral:   10.60 cm/s LVOT diam:     2.20 cm  LV E/e' lateral: 5.1 LV SV:         50 LV SV Index:   26 LVOT Area:     3.80 cm  RIGHT VENTRICLE RV Basal diam:  3.49 cm RV Mid diam:    4.95 cm TAPSE (M-mode): 1.2 cm LEFT ATRIUM           Index       RIGHT ATRIUM           Index LA diam:      3.90 cm 2.05 cm/m  RA Area:     21.60 cm LA Vol (A4C): 34.7 ml 18.22 ml/m RA Volume:   67.20 ml  35.28 ml/m  AORTIC VALVE                   PULMONIC VALVE AV Area (Vmax):    3.03 cm    PV Vmax:       0.90 m/s AV Area (Vmean):   2.84 cm    PV Vmean:      58.700 cm/s AV Area (VTI):     3.35 cm    PV VTI:        0.105 m AV Vmax:           148.00 cm/s PV Peak grad:  3.3 mmHg AV Vmean:          93.700 cm/s PV Mean grad:  2.0 mmHg AV VTI:            0.150 m AV Peak Grad:      8.8 mmHg AV Mean Grad:      4.0 mmHg LVOT Vmax:         118.00 cm/s LVOT Vmean:        70.000 cm/s LVOT VTI:          0.132 m LVOT/AV VTI ratio: 0.88  AORTA Ao Root diam: 3.50 cm MITRAL VALVE               TRICUSPID VALVE MV Area (PHT): 5.46 cm    TR Peak grad:   16.6 mmHg MV Area VTI:   3.37 cm    TR Vmax:        204.00 cm/s MV Peak grad:  1.9 mmHg MV Mean grad:  1.0 mmHg     SHUNTS MV Vmax:       0.68 m/s    Systemic VTI:  0.13 m MV Vmean:      42.8 cm/s   Systemic Diam: 2.20 cm MV Decel Time: 139 msec MV E velocity: 54.00 cm/s MV A velocity: 41.60 cm/s MV E/A ratio:  1.30 Nelva Bush MD Electronically signed by Nelva Bush MD Signature Date/Time: 07/05/2020/5:18:43 PM    Final    US Abdomen Limited RUQ (LIVER/GB)  Result Date: 07/11/2020 CLINICAL DATA:  Hyperbilirubinemia. EXAM: ULTRASOUND ABDOMEN LIMITED RIGHT UPPER QUADRANT COMPARISON:  None. FINDINGS: Gallbladder: Diffuse gallbladder wall thickening with pericholecystic fluid. No gallstones or gallbladder dilation. Sonographic Murphy sign not evaluated by sonographer as patient is intubated. Common bile duct: Diameter: 2.9 mm Liver: No focal lesion identified. Diffusely increased echogenicity. Portal vein is patent on color Doppler imaging with normal direction of blood flow towards the liver. Other: None. IMPRESSION: 1. Peri cholecystic fluid with diffuse gallbladder wall thickening without gallstones or gallbladder/common bile duct dilation. Findings which are favored to be sequela of hepatic dysfunction. If concern for cholecystitis recommend nuclear medicine HIDA scan. 2. The echogenicity of the liver is increased. This is a nonspecific finding but is most commonly seen with fatty infiltration. There are no obvious focal liver lesions. Electronically Signed   By: Dahlia Bailiff MD   On: 06/28/2020 17:22    Scheduled Meds: . vitamin C  500 mg Per Tube Daily  . chlorhexidine gluconate (MEDLINE KIT)  15 mL Mouth Rinse BID  . Chlorhexidine Gluconate Cloth  6 each Topical Daily  . dextrose  1 ampule Intravenous Once  . dextrose  1 ampule Intravenous Once  . fentaNYL (SUBLIMAZE) injection  25 mcg Intravenous Once  . hydrocortisone sod succinate (SOLU-CORTEF) inj  50 mg Intravenous Q6H  . insulin aspart  0-15 Units Subcutaneous Q4H  . mouth rinse  15 mL Mouth  Rinse 10 times per day  . sodium chloride flush  3 mL  Intravenous Q12H   Continuous Infusions: . sodium chloride Stopped (July 24, 2020 0629)  . dextrose 40 mL/hr at 07-24-20 0900  . epinephrine Stopped (07/05/20 2105)  . famotidine (PEPCID) IV    . fentaNYL infusion INTRAVENOUS 175 mcg/hr (Jul 24, 2020 0900)  . midazolam    . norepinephrine (LEVOPHED) Adult infusion 22 mcg/min (2020/07/24 0900)  . phenylephrine (NEO-SYNEPHRINE) Adult infusion Stopped (Jul 24, 2020 0657)  . piperacillin-tazobactam    . prismasol BGK 0/2.5 Stopped (07/05/20 1739)  . prismasol BGK 0/2.5 Stopped (07/05/20 1739)  . prismasol BGK 0/2.5 2,000 mL/hr at 2020/07/24 0030  . propofol (DIPRIVAN) infusion 15 mcg/kg/min (2020/07/24 0900)  .  sodium bicarbonate (isotonic) infusion in sterile water 125 mL/hr at 24-Jul-2020 0900  . vasopressin 0.04 Units/min (07/24/2020 0909)   PRN Meds:.sodium chloride, acetaminophen, fentaNYL, heparin, ondansetron (ZOFRAN) IV, polyethylene glycol, sodium chloride, sodium chloride flush, vecuronium   Assessment & Plan: Pt is a 65 y.o.   male with diabetes, gout, hypertension, was admitted on 07/21/2020 with ARDS (adult respiratory distress syndrome) ([facility]) [J80] Bilirubinemia [E80.6] Respiratory distress [R06.03] Hypoxia [R09.02] Encounter for central line placement [Z45.2] NSTEMI (non-ST elevated myocardial infarction) ([place]) [I21.4] COVID-19 [U07.1]   #Acute kidney injury -Last known baseline of 1.5 on July 04, 2020  #Hyperkalemia #Acute respiratory distress syndrome, acute respiratory failure #COVID-19 pneumonia #Severe lactic acidosis  #Hyponatremia # Shock Liver # Diabetes, type 2.  Lab Results  Component Value Date   HGBA1C 5.9 (H) 07/05/2020     Plan: Supportive care and treatment for ARDS/COVID-19 as per ICU protocols Dialyzer clotting - try Oxiris dialyzer D 10 for low blood sugars Continue CRRT 2 K bath UF -50 / hr   LOS: 2 Ayaka Andes 01/30/20229:20 AM    Note: This note was prepared with Dragon dictation. Any  transcription errors are unintentional

## 2020-07-26 NOTE — Death Summary Note (Signed)
DEATH SUMMARY   Patient Details  Name: Derek LamasFrancisco B Brum MRN: [id number] DOB: 02/20/1956  Admission/Discharge Information   Admit Date:  08-Nov-2020  Date of Death: Date of Death: 06/29/2020  Time of Death: Time of Death: 1758  Length of Stay: 2  Referring Physician: [facility]   Reason(s) for Hospitalization  COVID 19 pneumonia  Diagnoses  Preliminary cause of death: COVID 19 pneumonia Secondary Diagnoses (including complications and co-morbidities):  Active Problems:   ARDS (adult respiratory distress syndrome) (HCC)   COVID-19   NSTEMI (non-ST elevated myocardial infarction) [facility](HCC)   Brief Hospital Course (including significant findings, care, treatment, and services provided and events leading to death)    65 yo white male with severe hypoxia Dx with COVID 1 week ago Developed SOB and chest congestion acutely in last 24 hrs  Patient with severe hypoxia with confusion with b/l Infiltrates on CXR Patient emergently intubated and sedated   1/10 admitted to ICU for severe COVID pneumonia 1/11 wife updated DNR status 1/12 severe ARDS, resp failure  Acute hypoxemic respiratory failure due to COVID-19 pneumonia / ARDS Mechanical ventilation via ARDS protocol, target PRVC 6 cc/kg Wean PEEP and FiO2 as able Goal plateau pressure less than 30, driving pressure less than 15 Paralytics if necessary for vent synchrony, gas exchange Cycle prone positioning not a candidate at this time Deep sedation per PAD protocol, goal RASS -4,  VAP prevention order set Remdesivir  IV STEROIDS     Severe ACUTE Hypoxic and Hypercapnic Respiratory Failure -continue Full MV support -continue Bronchodilator Therapy -Wean Fio2 and PEEP as tolerated -VAP/VENT bundle implementation   Morbid obesity, possible OSA.  Will certainly impact respiratory mechanics, ventilator weaning Suspect will need to consider additional PEEP  ACUTE KIDNEY INJURY/Renal  Failure -continue Foley Catheter-assess need -Avoid nephrotoxic agents -Follow urine output, BMP -Ensure adequate renal perfusion, optimize oxygenation -Renal dose medications Started on CRRT    NEUROLOGY Acute toxic metabolic encephalopathy, need for sedation Goal RASS -2 to -3  SHOCK-SEPSIS -use vasopressors to keep MAP>65 -follow ABG and LA -follow up cultures -emperic ABX   CARDIAC ICU monitoring  ID -continue IV abx as prescibed -follow up cultures  GI GI PROPHYLAXIS as indicated LIVER FAILURE SEVERE HYPOGLYCEMIA Started on D10  DIET-->NPO Constipation protocol as indicated  ENDO - will use ICU hypoglycemic\Hyperglycemia protocol if indicated    ELECTROLYTES -follow labs as needed -replace as needed -pharmacy consultation and following      GOALS OF CARE DISCUSSION  The Clinical status was relayed to family in detail.  Updated and notified of patients medical condition.  Patient remains unresponsive and will not open eyes to command.   Upon assessment his breath sounds are course crackles with significant secretions to oral pharyngeal region.  Nasopharyngeal suction produced copious sanguineous secretions.    Patient is having a weak cough and struggling to remove secretions.   patient with increased WOB and using accessory muscles to breathe Explained to family course of therapy and the modalities     Patient with Progressive multiorgan failure with very low chance of meaningful recovery despite all aggressive and optimal medical therapy. Patient is in the Dying  Process associated with Suffering.  Family understands the situation.  They have consented and agreed to DNR/DNI and would like to proceed with Comfort care measures.  Family are satisfied with Plan of action and management. All questions answered     Pertinent Labs and Studies  Significant Diagnostic Studies DG Chest 1 View  Result Date: 07/13/2020 CLINICAL  DATA:  Respiratory distress, positive COVID test 10 days ago, post intubation. EXAM: CHEST  1 VIEW COMPARISON:  Earlier the same day at 1052 hours. FINDINGS: Endotracheal tube terminates 1.4 cm above the carina. Heart size stable. Worsening patchy bilateral airspace opacification with low lung volumes. No definite pleural fluid. IMPRESSION: 1. Endotracheal tube is low lying. Retracting 3-4 cm would better position the tip above the carina. 2. Worsening bilateral airspace opacification, indicative of COVID-19 pneumonia. Electronically Signed   By: Leanna Battles M.D.   On: 06/26/2020 13:15   DG Abdomen 1 View  Result Date: 07/05/2020 CLINICAL DATA:  Nasogastric tube placement. EXAM: ABDOMEN - 1 VIEW COMPARISON:  None. FINDINGS: The bowel gas pattern is normal. Distal tip of nasogastric tube is seen in the proximal stomach. No radio-opaque calculi or other significant radiographic abnormality are seen. IMPRESSION: Distal tip of nasogastric tube seen in proximal stomach. Electronically Signed   By: Lupita Raider M.D.   On: 06/27/2020 14:06   DG Chest Port 1 View  Result Date: 07/05/2020 CLINICAL DATA:  Central line placement. EXAM: PORTABLE CHEST 1 VIEW COMPARISON:  July 04, 2020. FINDINGS: The heart size and mediastinal contours are within normal limits. Endotracheal nasogastric tubes are unchanged in position. Right internal jugular catheter is unchanged. Interval placement of left internal jugular catheter with distal tip in expected position of the SVC. No pneumothorax or pleural effusion is noted. Bilateral patchy airspace opacities are again noted consistent with multifocal pneumonia. The visualized skeletal structures are unremarkable. IMPRESSION: Interval placement of left internal jugular catheter with distal tip in expected position of the SVC. No pneumothorax is noted. Stable bilateral patchy airspace opacities consistent with multifocal pneumonia. Electronically Signed   By: Lupita Raider  M.D.   On: 07/05/2020 13:57   DG Chest Port 1 View  Result Date: 07/05/2020 CLINICAL DATA:  Central line placement EXAM: PORTABLE CHEST 1 VIEW COMPARISON:  06/29/2020 FINDINGS: Endotracheal tube 3.5 cm above the carina. Right central line tip in the SVC. No pneumothorax. NG tube is in the stomach. Bilateral airspace disease, right greater than left, unchanged. Heart is normal size. IMPRESSION: Right central line tip in the SVC.  No pneumothorax. Other support devices as above. Stable bilateral airspace disease. Electronically Signed   By: Charlett Nose M.D.   On: 07/05/2020 00:17   DG Chest Portable 1 View  Result Date: 07/21/2020 CLINICAL DATA:  Respiratory distress.  Hypoxia.  COVID pneumonia. EXAM: PORTABLE CHEST 1 VIEW COMPARISON:  None. FINDINGS: Mildly low lung volumes with indistinct pulmonary vasculature and hazy interstitial and patchy airspace opacities in both lungs. Borderline enlargement of the cardiopericardial silhouette. No blunting of the costophrenic angles. Thoracic spondylosis. Prominent upper mediastinum is nonspecific and may be related to portable AP technique. IMPRESSION: 1. Bilateral interstitial and patchy airspace opacities in both lungs, compatible with atypical pneumonia or edema. 2. Borderline enlargement of the cardiopericardial silhouette. 3. Thoracic spondylosis. Electronically Signed   By: Gaylyn Rong M.D.   On: 07/05/2020 11:14   ECHOCARDIOGRAM COMPLETE  Result Date: 07/05/2020    ECHOCARDIOGRAM REPORT   Patient Name:   BERNELL SIGAL Date of Exam: 07/05/2020 Medical Rec #:  [id number]         Height:       65.0 in Accession #:    [id number]        Weight:       183.0 lb Date of Birth:  March 01, 1956  BSA:          1.905 m Patient Age:    64 years          BP:           87/31 mmHg Patient Gender: M                 HR:           103 bpm. Exam Location:  [facility] Procedure: 2D Echo, Color Doppler and Cardiac Doppler Indications:     Elevated troponin  History:          Patient has no prior history of Echocardiogram examinations.                  Risk Factors:Hypertension and Diabetes. Pt tested positive for                  COVID-19 a week ago from 07/05/20.  Sonographer:     Humphrey Rolls RDCS (AE) Referring Phys:  [id number] Judithe Modest Diagnosing Phys: Yvonne Kendall MD  Sonographer Comments: Suboptimal apical window and echo performed with patient supine and on artificial respirator. IMPRESSIONS  1. Left ventricular ejection fraction, by estimation, is 65 to 70%. The left ventricle has normal function. Left ventricular endocardial border not optimally defined to evaluate regional wall motion. Left ventricular diastolic parameters were normal.  2. Right ventricular systolic function is moderately reduced. The right ventricular size is moderately enlarged.  3. Right atrial size was mildly dilated.  4. The mitral valve is grossly normal. No evidence of mitral valve regurgitation. No evidence of mitral stenosis.  5. The aortic valve has an indeterminant number of cusps. Aortic valve regurgitation is not visualized. No aortic stenosis is present. Conclusion(s)/Recommendation(s): Hyperdynamic left ventricular contraction with dilated right ventricle and severe hypokinesis of the mid RV free wall. Findings are concerning for acute cor pulmonale as can be see with pulmonary embolism (McConnell's sign). Findings were conveyed to Dr. Belia Heman at 1:00 PM on 07/05/2020. FINDINGS  Left Ventricle: Left ventricular ejection fraction, by estimation, is 65 to 70%. The left ventricle has normal function. Left ventricular endocardial border not optimally defined to evaluate regional wall motion. The left ventricular internal cavity size was normal in size. There is no left ventricular hypertrophy. Left ventricular diastolic parameters were normal. Right Ventricle: There is marked hypokinesis of the mid freewall of the right ventricle. The right ventricular size is moderately enlarged. No increase  in right ventricular wall thickness. Right ventricular systolic function is moderately reduced. Left Atrium: Left atrial size was normal in size. Right Atrium: Right atrial size was mildly dilated. Pericardium: There is no evidence of pericardial effusion. Mitral Valve: The mitral valve is grossly normal. No evidence of mitral valve regurgitation. No evidence of mitral valve stenosis. MV peak gradient, 1.9 mmHg. The mean mitral valve gradient is 1.0 mmHg. Tricuspid Valve: The tricuspid valve is grossly normal. Tricuspid valve regurgitation is trivial. Aortic Valve: The aortic valve has an indeterminant number of cusps. Aortic valve regurgitation is not visualized. No aortic stenosis is present. Aortic valve mean gradient measures 4.0 mmHg. Aortic valve peak gradient measures 8.8 mmHg. Aortic valve area, by VTI measures 3.35 cm. Pulmonic Valve: The pulmonic valve was not well visualized. Pulmonic valve regurgitation is not visualized. No evidence of pulmonic stenosis. Aorta: The aortic root is normal in size and structure. Pulmonary Artery: The pulmonary artery is not well seen. IAS/Shunts: The interatrial septum was not well visualized.  LEFT VENTRICLE PLAX  2D LVIDd:         4.13 cm  Diastology LVIDs:         2.18 cm  LV e' medial:    7.51 cm/s LV PW:         0.90 cm  LV E/e' medial:  7.2 LV IVS:        0.74 cm  LV e' lateral:   10.60 cm/s LVOT diam:     2.20 cm  LV E/e' lateral: 5.1 LV SV:         50 LV SV Index:   26 LVOT Area:     3.80 cm  RIGHT VENTRICLE RV Basal diam:  3.49 cm RV Mid diam:    4.95 cm TAPSE (M-mode): 1.2 cm LEFT ATRIUM           Index       RIGHT ATRIUM           Index LA diam:      3.90 cm 2.05 cm/m  RA Area:     21.60 cm LA Vol (A4C): 34.7 ml 18.22 ml/m RA Volume:   67.20 ml  35.28 ml/m  AORTIC VALVE                   PULMONIC VALVE AV Area (Vmax):    3.03 cm    PV Vmax:       0.90 m/s AV Area (Vmean):   2.84 cm    PV Vmean:      58.700 cm/s AV Area (VTI):     3.35 cm    PV VTI:         0.105 m AV Vmax:           148.00 cm/s PV Peak grad:  3.3 mmHg AV Vmean:          93.700 cm/s PV Mean grad:  2.0 mmHg AV VTI:            0.150 m AV Peak Grad:      8.8 mmHg AV Mean Grad:      4.0 mmHg LVOT Vmax:         118.00 cm/s LVOT Vmean:        70.000 cm/s LVOT VTI:          0.132 m LVOT/AV VTI ratio: 0.88  AORTA Ao Root diam: 3.50 cm MITRAL VALVE               TRICUSPID VALVE MV Area (PHT): 5.46 cm    TR Peak grad:   16.6 mmHg MV Area VTI:   3.37 cm    TR Vmax:        204.00 cm/s MV Peak grad:  1.9 mmHg MV Mean grad:  1.0 mmHg    SHUNTS MV Vmax:       0.68 m/s    Systemic VTI:  0.13 m MV Vmean:      42.8 cm/s   Systemic Diam: 2.20 cm MV Decel Time: 139 msec MV E velocity: 54.00 cm/s MV A velocity: 41.60 cm/s MV E/A ratio:  1.30 Yvonne Kendall MD Electronically signed by Yvonne Kendall MD Signature Date/Time: 07/05/2020/5:18:43 PM    Final    US Abdomen Limited RUQ (LIVER/GB)  Result Date: 07-20-20 CLINICAL DATA:  Hyperbilirubinemia. EXAM: ULTRASOUND ABDOMEN LIMITED RIGHT UPPER QUADRANT COMPARISON:  None. FINDINGS: Gallbladder: Diffuse gallbladder wall thickening with pericholecystic fluid. No gallstones or gallbladder dilation. Sonographic Murphy sign not evaluated by sonographer as patient is intubated. Common bile duct:  Diameter: 2.9 mm Liver: No focal lesion identified. Diffusely increased echogenicity. Portal vein is patent on color Doppler imaging with normal direction of blood flow towards the liver. Other: None. IMPRESSION: 1. Peri cholecystic fluid with diffuse gallbladder wall thickening without gallstones or gallbladder/common bile duct dilation. Findings which are favored to be sequela of hepatic dysfunction. If concern for cholecystitis recommend nuclear medicine HIDA scan. 2. The echogenicity of the liver is increased. This is a nonspecific finding but is most commonly seen with fatty infiltration. There are no obvious focal liver lesions. Electronically Signed   By: Maudry MayhewJeffrey  Waltz MD    On: 07/05/2020 17:22    Microbiology Recent Results (from the past 240 hour(s))  Culture, blood (single)     Status: None (Preliminary result)   Collection Time: 07/24/2020 10:41 AM   Specimen: BLOOD  Result Value Ref Range Status   Specimen Description BLOOD RIGHT [facility]  Final   Special Requests   Final    BOTTLES DRAWN AEROBIC AND ANAEROBIC Blood Culture results may not be optimal due to an inadequate volume of blood received in culture bottles   Culture   Final    NO GROWTH 2 DAYS Performed at [facility], [address] [place], [place] [address]    Report Status PENDING  Incomplete  Blood culture (single)     Status: None (Preliminary result)   Collection Time: 06/27/2020 10:41 AM   Specimen: BLOOD  Result Value Ref Range Status   Specimen Description BLOOD BLOOD RIGHT HAND  Final   Special Requests   Final    BOTTLES DRAWN AEROBIC AND ANAEROBIC Blood Culture results may not be optimal due to an inadequate volume of blood received in culture bottles   Culture   Final    NO GROWTH 2 DAYS Performed at [facility], [address] [place], [place] [address]    Report Status PENDING  Incomplete  MRSA PCR Screening     Status: None   Collection Time: 07/05/20 12:22 PM   Specimen: Nasopharyngeal  Result Value Ref Range Status   MRSA by PCR NEGATIVE NEGATIVE Final    Comment:        The GeneXpert MRSA Assay (FDA approved for NASAL specimens only), is one component of a comprehensive MRSA colonization surveillance program. It is not intended to diagnose MRSA infection nor to guide or monitor treatment for MRSA infections. Performed at [facility], [address] [place], [place] [address]     Lab Basic Metabolic Panel: Recent Labs  Lab 07/16/2020 1041 07/05/20 0039 07/05/20 1720 04/26/21 0149 04/26/21 0500  NA 129* 128* 136  --  137  K 4.1 6.7* 5.4*  --  4.7  CL 90* 93* 87*  --  93*  CO2 13* 15* 23  --  23  GLUCOSE 173* 212* 89  --   114*  BUN 17 21 27*  --  21  CREATININE 1.51* 2.96* 3.88*  --  2.73*  CALCIUM 8.4* 7.0* 5.0* 5.5* 5.8*  MG 2.8*  --   --   --  2.5*  PHOS  --   --  12.4*  --   --    Liver Function Tests: Recent Labs  Lab 06/29/2020 1041 07/05/20 0909 07/05/20 1720 04/26/21 0500  AST 480* >10,000*  --  >10,000*  ALT 317* 4,943*  --  5,626*  ALKPHOS 111 190*  --  316*  BILITOT 1.9* 3.2*  --  3.6*  PROT 7.7 5.2*  --  5.0*  ALBUMIN 2.7* 1.8* 2.0* 1.9*   No results for input(s): LIPASE, AMYLASE in the last 168 hours. No results for input(s): AMMONIA in the last 168 hours. CBC: Recent Labs  Lab 07/20/2020 1041 07/05/20 1212 07/05/20 1412 08/03/20 1048  WBC 21.5* 36.7* 37.1* 24.9*  NEUTROABS 17.0*  --   --  22.0*  HGB 12.7* 12.3* 11.6* 9.3*  HCT 37.5* 37.4* 35.5* 29.0*  MCV 82.8 86.0 85.5 86.6  PLT 208 59* 66* 53*   Cardiac Enzymes: Recent Labs  Lab 08-03-2020 0500  CKTOTAL 863*   Sepsis Labs: Recent Labs  Lab 07/15/2020 1041 06/27/2020 1216 07/05/20 0039 07/05/20 0041 07/05/20 0909 07/05/20 1212 07/05/20 1412 08/03/2020 0149 August 03, 2020 0500 Aug 03, 2020 0600 03-Aug-2020 1048  PROCALCITON  --   --  18.86  --  30.48  --   --   --  21.73  --   --   WBC 21.5*  --   --   --   --  36.7* 37.1*  --   --   --  24.9*  LATICACIDVEN >11.0*   < >  --  9.8* 7.8*  --   --  10.4*  --  9.3*  --    < > = values in this interval not displayed.     Erin Fulling August 03, 2020, 6:25 PM

## 2020-07-26 NOTE — Progress Notes (Signed)
CRITICAL CARE NOTE  65 yo white male with severe hypoxia Dx with COVID 1 week ago Developed SOB and chest congestion acutely in last 24 hrs  Patient with severe hypoxia with confusion with b/l Infiltrates on CXR Patient emergently intubated and sedated   1/10 admitted to ICU for severe COVID pneumonia 1/11 wife updated DNR status 1/12 severe ARDS, resp failure  CC  follow up respiratory failure  SUBJECTIVE Patient remains critically ill Prognosis is guarded  Vent Mode: PRVC FiO2 (%):  [100 %] 100 % Set Rate:  [24 bmp-30 bmp] 30 bmp Vt Set:  [480 mL] 480 mL PEEP:  [15 cmH20] 15 cmH20  CBC    Component Value Date/Time   WBC 37.1 (H) 07/05/2020 1412   RBC 4.15 (L) 07/05/2020 1412   HGB 11.6 (L) 07/05/2020 1412   HGB 11.3 (L) 04/17/2013 0603   HCT 35.5 (L) 07/05/2020 1412   HCT 41.1 04/01/2013 1431   PLT 66 (L) 07/05/2020 1412   PLT 205 04/17/2013 0603   MCV 85.5 07/05/2020 1412   MCV 85 04/01/2013 1431   MCH 28.0 07/05/2020 1412   MCHC 32.7 07/05/2020 1412   RDW 16.6 (H) 07/05/2020 1412   RDW 14.0 04/01/2013 1431   LYMPHSABS 2.1 06/28/2020 1041   MONOABS 0.6 07/25/2020 1041   EOSABS 0.0 07/09/2020 1041   BASOSABS 0.1 07/20/2020 1041   BMP Latest Ref Rng & Units 07-10-2020 07/05/2020 07/05/2020  Glucose 70 - 99 mg/dL - 89 627(O)  BUN 8 - 23 mg/dL - 35(K) 21  Creatinine 0.61 - 1.24 mg/dL - 0.93(G) 1.82(X)  Sodium 135 - 145 mmol/L - 136 128(L)  Potassium 3.5 - 5.1 mmol/L - 5.4(H) 6.7(HH)  Chloride 98 - 111 mmol/L - 87(L) 93(L)  CO2 22 - 32 mmol/L - 23 15(L)  Calcium 8.9 - 10.3 mg/dL 9.3(ZJ) 6.9(CV) 7.0(L)    BP (!) 106/42   Pulse 89   Temp (!) 96.08 F (35.6 C)   Resp (!) 28   Ht 5\' 5"  (1.651 m)   Wt 83 kg   SpO2 94%   BMI 30.45 kg/m    I/O last 3 completed shifts: In: 8297.6 [I.V.:7395.8; Other:101; IV Piggyback:800.8] Out: 1745 [Urine:750; Other:995] No intake/output data recorded.  SpO2: 94 % FiO2 (%): 100 %  Estimated body mass index is  30.45 kg/m as calculated from the following:   Height as of this encounter: 5\' 5"  (1.651 m).   Weight as of this encounter: 83 kg.  SIGNIFICANT EVENTS   REVIEW OF SYSTEMS  PATIENT IS UNABLE TO PROVIDE COMPLETE REVIEW OF SYSTEMS DUE TO SEVERE CRITICAL ILLNESS      COVID-19 DISASTER DECLARATION:   FULL CONTACT PHYSICAL EXAMINATION WAS NOT POSSIBLE DUE TO TREATMENT OF COVID-19  AND CONSERVATION OF PERSONAL PROTECTIVE EQUIPMENT, LIMITED EXAM FINDINGS INCLUDE-   PHYSICAL EXAMINATION:  GENERAL:critically ill appearing, +resp distress NEUROLOGIC: obtunded, GCS<8   Patient assessed or the symptoms described in the history of present illness.  In the context of the Global COVID-19 pandemic, which necessitated consideration that the patient might be at risk for infection with the SARS-CoV-2 virus that causes COVID-19, Institutional protocols and algorithms that pertain to the evaluation of patients at risk for COVID-19 are in a state of rapid change based on information released by regulatory bodies including the CDC and federal and state organizations. These policies and algorithms were followed during the patient's care while in hospital.    MEDICATIONS: I have reviewed all medications and confirmed regimen as  documented   CULTURE RESULTS   Recent Results (from the past 240 hour(s))  Culture, blood (single)     Status: None (Preliminary result)   Collection Time: Dec 12, 2020 10:41 AM   Specimen: BLOOD  Result Value Ref Range Status   Specimen Description BLOOD RIGHT AC  Final   Special Requests   Final    BOTTLES DRAWN AEROBIC AND ANAEROBIC Blood Culture results may not be optimal due to an inadequate volume of blood received in culture bottles   Culture   Final    NO GROWTH 2 DAYS Performed at [facility], [address] [place], [place] [address]    Report Status PENDING  Incomplete  Blood culture (single)     Status: None (Preliminary result)   Collection Time:  Dec 12, 2020 10:41 AM   Specimen: BLOOD  Result Value Ref Range Status   Specimen Description BLOOD BLOOD RIGHT HAND  Final   Special Requests   Final    BOTTLES DRAWN AEROBIC AND ANAEROBIC Blood Culture results may not be optimal due to an inadequate volume of blood received in culture bottles   Culture   Final    NO GROWTH 2 DAYS Performed at [facility], [address] [place], [place] [address]    Report Status PENDING  Incomplete  MRSA PCR Screening     Status: None   Collection Time: 07/05/20 12:22 PM   Specimen: Nasopharyngeal  Result Value Ref Range Status   MRSA by PCR NEGATIVE NEGATIVE Final    Comment:        The GeneXpert MRSA Assay (FDA approved for NASAL specimens only), is one component of a comprehensive MRSA colonization surveillance program. It is not intended to diagnose MRSA infection nor to guide or monitor treatment for MRSA infections. Performed at [facility], [address] [place], [place] [address]           IMAGING    DG Chest Port 1 View  Result Date: 07/05/2020 CLINICAL DATA:  Central line placement. EXAM: PORTABLE CHEST 1 VIEW COMPARISON:  July 04, 2020. FINDINGS: The heart size and mediastinal contours are within normal limits. Endotracheal nasogastric tubes are unchanged in position. Right internal jugular catheter is unchanged. Interval placement of left internal jugular catheter with distal tip in expected position of the SVC. No pneumothorax or pleural effusion is noted. Bilateral patchy airspace opacities are again noted consistent with multifocal pneumonia. The visualized skeletal structures are unremarkable. IMPRESSION: Interval placement of left internal jugular catheter with distal tip in expected position of the SVC. No pneumothorax is noted. Stable bilateral patchy airspace opacities consistent with multifocal pneumonia. Electronically Signed   By: Lupita RaiderJames  Green Jr M.D.   On: 07/05/2020 13:57   ECHOCARDIOGRAM  COMPLETE  Result Date: 07/05/2020    ECHOCARDIOGRAM REPORT   Patient Name:   Jarome LamasFRANCISCO B Kaltenbach Date of Exam: 07/05/2020 Medical Rec #:  [id number]         Height:       65.0 in Accession #:    [id number]        Weight:       183.0 lb Date of Birth:  10/29/1955          BSA:          1.905 m Patient Age:    64 years          BP:           87/31 mmHg Patient Gender: M  HR:           103 bpm. Exam Location:  [facility] Procedure: 2D Echo, Color Doppler and Cardiac Doppler Indications:     Elevated troponin  History:         Patient has no prior history of Echocardiogram examinations.                  Risk Factors:Hypertension and Diabetes. Pt tested positive for                  COVID-19 a week ago from 07/05/20.  Sonographer:     Humphrey Rolls RDCS (AE) Referring Phys:  [id number] Judithe Modest Diagnosing Phys: Yvonne Kendall MD  Sonographer Comments: Suboptimal apical window and echo performed with patient supine and on artificial respirator. IMPRESSIONS  1. Left ventricular ejection fraction, by estimation, is 65 to 70%. The left ventricle has normal function. Left ventricular endocardial border not optimally defined to evaluate regional wall motion. Left ventricular diastolic parameters were normal.  2. Right ventricular systolic function is moderately reduced. The right ventricular size is moderately enlarged.  3. Right atrial size was mildly dilated.  4. The mitral valve is grossly normal. No evidence of mitral valve regurgitation. No evidence of mitral stenosis.  5. The aortic valve has an indeterminant number of cusps. Aortic valve regurgitation is not visualized. No aortic stenosis is present. Conclusion(s)/Recommendation(s): Hyperdynamic left ventricular contraction with dilated right ventricle and severe hypokinesis of the mid RV free wall. Findings are concerning for acute cor pulmonale as can be see with pulmonary embolism (McConnell's sign). Findings were conveyed to Dr. Belia Heman at 1:00 PM on 07/05/2020.  FINDINGS  Left Ventricle: Left ventricular ejection fraction, by estimation, is 65 to 70%. The left ventricle has normal function. Left ventricular endocardial border not optimally defined to evaluate regional wall motion. The left ventricular internal cavity size was normal in size. There is no left ventricular hypertrophy. Left ventricular diastolic parameters were normal. Right Ventricle: There is marked hypokinesis of the mid freewall of the right ventricle. The right ventricular size is moderately enlarged. No increase in right ventricular wall thickness. Right ventricular systolic function is moderately reduced. Left Atrium: Left atrial size was normal in size. Right Atrium: Right atrial size was mildly dilated. Pericardium: There is no evidence of pericardial effusion. Mitral Valve: The mitral valve is grossly normal. No evidence of mitral valve regurgitation. No evidence of mitral valve stenosis. MV peak gradient, 1.9 mmHg. The mean mitral valve gradient is 1.0 mmHg. Tricuspid Valve: The tricuspid valve is grossly normal. Tricuspid valve regurgitation is trivial. Aortic Valve: The aortic valve has an indeterminant number of cusps. Aortic valve regurgitation is not visualized. No aortic stenosis is present. Aortic valve mean gradient measures 4.0 mmHg. Aortic valve peak gradient measures 8.8 mmHg. Aortic valve area, by VTI measures 3.35 cm. Pulmonic Valve: The pulmonic valve was not well visualized. Pulmonic valve regurgitation is not visualized. No evidence of pulmonic stenosis. Aorta: The aortic root is normal in size and structure. Pulmonary Artery: The pulmonary artery is not well seen. IAS/Shunts: The interatrial septum was not well visualized.  LEFT VENTRICLE PLAX 2D LVIDd:         4.13 cm  Diastology LVIDs:         2.18 cm  LV e' medial:    7.51 cm/s LV PW:         0.90 cm  LV E/e' medial:  7.2 LV IVS:  0.74 cm  LV e' lateral:   10.60 cm/s LVOT diam:     2.20 cm  LV E/e' lateral: 5.1 LV SV:          50 LV SV Index:   26 LVOT Area:     3.80 cm  RIGHT VENTRICLE RV Basal diam:  3.49 cm RV Mid diam:    4.95 cm TAPSE (M-mode): 1.2 cm LEFT ATRIUM           Index       RIGHT ATRIUM           Index LA diam:      3.90 cm 2.05 cm/m  RA Area:     21.60 cm LA Vol (A4C): 34.7 ml 18.22 ml/m RA Volume:   67.20 ml  35.28 ml/m  AORTIC VALVE                   PULMONIC VALVE AV Area (Vmax):    3.03 cm    PV Vmax:       0.90 m/s AV Area (Vmean):   2.84 cm    PV Vmean:      58.700 cm/s AV Area (VTI):     3.35 cm    PV VTI:        0.105 m AV Vmax:           148.00 cm/s PV Peak grad:  3.3 mmHg AV Vmean:          93.700 cm/s PV Mean grad:  2.0 mmHg AV VTI:            0.150 m AV Peak Grad:      8.8 mmHg AV Mean Grad:      4.0 mmHg LVOT Vmax:         118.00 cm/s LVOT Vmean:        70.000 cm/s LVOT VTI:          0.132 m LVOT/AV VTI ratio: 0.88  AORTA Ao Root diam: 3.50 cm MITRAL VALVE               TRICUSPID VALVE MV Area (PHT): 5.46 cm    TR Peak grad:   16.6 mmHg MV Area VTI:   3.37 cm    TR Vmax:        204.00 cm/s MV Peak grad:  1.9 mmHg MV Mean grad:  1.0 mmHg    SHUNTS MV Vmax:       0.68 m/s    Systemic VTI:  0.13 m MV Vmean:      42.8 cm/s   Systemic Diam: 2.20 cm MV Decel Time: 139 msec MV E velocity: 54.00 cm/s MV A velocity: 41.60 cm/s MV E/A ratio:  1.30 Cristal Deer End MD Electronically signed by Yvonne Kendall MD Signature Date/Time: 07/05/2020/5:18:43 PM    Final      Nutrition Status: Nutrition Problem: Inadequate oral intake Etiology: inability to eat Signs/Symptoms: NPO status Interventions: Refer to RD note for recommendations     Indwelling Urinary Catheter continued, requirement due to   Reason to continue Indwelling Urinary Catheter strict Intake/Output monitoring for hemodynamic instability   Central Line/ continued, requirement due to  Reason to continue [employer] Monitoring of central venous pressure or other hemodynamic parameters and poor IV access   Ventilator continued,  requirement due to severe respiratory failure   Ventilator Sedation RASS 0 to -2      ASSESSMENT AND PLAN SYNOPSIS  65 yo obese white male with severe ARDS  with severe and acute hypoxic resp failure due to COVID 19 pneumonia causing NSTEMI and acute renal failure with acidosis    Acute hypoxemic respiratory failure due to COVID-19 pneumonia / ARDS Mechanical ventilation via ARDS protocol, target PRVC 6 cc/kg Wean PEEP and FiO2 as able Goal plateau pressure less than 30, driving pressure less than 15 Paralytics if necessary for vent synchrony, gas exchange Cycle prone positioning not a candidate at this time Deep sedation per PAD protocol, goal RASS -4,  VAP prevention order set Remdesivir  IV STEROIDS     Severe ACUTE Hypoxic and Hypercapnic Respiratory Failure -continue Full MV support -continue Bronchodilator Therapy -Wean Fio2 and PEEP as tolerated -VAP/VENT bundle implementation   Morbid obesity, possible OSA.   Will certainly impact respiratory mechanics, ventilator weaning Suspect will need to consider additional PEEP  ACUTE KIDNEY INJURY/Renal Failure -continue Foley Catheter-assess need -Avoid nephrotoxic agents -Follow urine output, BMP -Ensure adequate renal perfusion, optimize oxygenation -Renal dose medications Started on CRRT     NEUROLOGY Acute toxic metabolic encephalopathy, need for sedation Goal RASS -2 to -3  SHOCK-SEPSIS -use vasopressors to keep MAP>65 -follow ABG and LA -follow up cultures -emperic ABX   CARDIAC ICU monitoring  ID -continue IV abx as prescibed -follow up cultures  GI GI PROPHYLAXIS as indicated LIVER FAILURE SEVERE HYPOGLYCEMIA Started on D10  DIET-->NPO Constipation protocol as indicated  ENDO - will use ICU hypoglycemic\Hyperglycemia protocol if indicated     ELECTROLYTES -follow labs as needed -replace as needed -pharmacy consultation and following   DVT/GI PRX ordered and  assessed TRANSFUSIONS AS NEEDED MONITOR FSBS I Assessed the need for Labs I Assessed the need for Foley I Assessed the need for Central Venous Line Family Discussion when available I Assessed the need for Mobilization I made an Assessment of medications to be adjusted accordingly Safety Risk assessment completed   CASE DISCUSSED IN MULTIDISCIPLINARY ROUNDS WITH ICU TEAM  Critical Care Time devoted to patient care services described in this note is 65 minutes.   Overall, patient is critically ill, prognosis is guarded.  Patient with Multiorgan failure and at high risk for cardiac arrest and death.   Patient is DNR +multiorgan failure  Lucie LeatherKurian David Bertis Hustead, M.D.  [facility]  Medical Director [facility] Medical Director [facility] Cardio-Pulmonary Department

## 2020-07-26 NOTE — Progress Notes (Signed)
Patient hypoglycemic overnight requiring two 1/2 amp of D50 pushes. Per the note from the dietitian today plan was to hold on off enteral feeds until more stable. D10 gtt started at 30cc/hr with hourly blood sugar checks until stable.    Amanda Cockayne ACNP-BC

## 2020-07-26 NOTE — Progress Notes (Addendum)
NP notified of critical values; INR of 4.9,  critical calcium level of 5.9 and CBG 66 with D10 infusing at 3ml/hr; Verbal order received to recheck calcium level after calcium gluconate IVPB is complete and increase D10 to 41ml/hr.

## 2020-07-26 NOTE — Progress Notes (Signed)
Lactate 10.4 up from 7.8 yesterday. Etiology of rising lactate unclear. Patient with transaminitis and requiring high dose vasopressors including Epi that could be contributing to persistently elevated lactate. Abdomen distended but not firm.   Given patient is coming down on his vasopressor requirements significantly overnight, including stopping the Epi, will trend lactates Q4hr for now. Repeat CMP is pending to evaluate liver function. If lactate worsens or vasopressor requirements increase again may need CT abdomen/pelvis to evaluate for other possible source.     Amanda Cockayne ACNP-BC

## 2020-07-26 NOTE — Progress Notes (Signed)
Neuro: sedation transitioned to Versed and Fentanyl, tolerating infusions well, the patient appears comfortable and free of pain, responds to pain, mainly with oral care Resp: continues to tolerate ventilator settings CV: remains on bair huggar for thermoregulation, pressures continue to be regulated by pressors infusions/titrationsGIGU: OG/foley remain in place, no output from either, no BM, no emesis Skin: skin continues to grow more mottled and jaundice throughout, fingers/hands and toes/feet grow darker in color/mottled/cold despite the bair huggar Social: Multiple family members came to visit today, they all were at the bedside, shared stories and expressed how much the patient means to them, family meeting took place with Dr. Belia Heman; family with transition the patient to comfort care following their visitation  Events: CRRT continued, circuit clotted x 1, changed to oxiris filter on further issues Family decided to transition to comfort care. Multiple family members visited with the patient.  His children, nephew, and others were at the bedside through the terminal extubation and ultimate passing. His wife came in following his passing. Time of death was pronounced by Astrid Drafts RN and Christel Haakmeeter RN as noted in the flowsheets. Post mortem care was started with night shift charge RN, Dow Adolph. Night shift to complete.

## 2020-07-26 NOTE — Progress Notes (Signed)
Neuro: on Fentanyl and Propofol, tolerating meds well, responds to pain, mainly oral care or gross extremity movements Resp: tolerating ventilator settings, minimal secretions CV: afebrile, foley temp probe monitoring, blood pressures being managed with multiple pressors-continuous titrating (see MAR) GIGU: foley/OG in place, no urine output, no BM, no emesis Skin: clean, dry, and intact-patient is pale with underlying jaundice, fingers and toes purple/red, extremities mottled Social: No contact with family.  Events: Received patient from the ED as a new admit, intubated, sedated, foley, CVC, PIV x 4, OG all in place and well tolerated. CRRT initiated, filter clotted x 1, pt was tolerating the treatment well, continued to titrate pressors throughout shift.

## 2020-07-26 NOTE — Progress Notes (Signed)
Derek Townsend and I maintained presence with this family for an extended time. The choice for comfort care came late evening with patient time of death @5 :58 p.m. Chaplain was paged to support family @ 6:10 p.m.

## 2020-07-26 DEATH — deceased

## 2020-08-10 LAB — BLOOD GAS, VENOUS
Acid-base deficit: 13.7 mmol/L — ABNORMAL HIGH (ref 0.0–2.0)
Bicarbonate: 14.3 mmol/L — ABNORMAL LOW (ref 20.0–28.0)
Delivery systems: POSITIVE
FIO2: 1
O2 Saturation: 32.1 %
Patient temperature: 37
pCO2, Ven: 40 mmHg — ABNORMAL LOW (ref 44.0–60.0)
pH, Ven: 7.16 — CL (ref 7.250–7.430)

## 2020-08-10 LAB — BLOOD GAS, ARTERIAL
Acid-base deficit: 7.7 mmol/L — ABNORMAL HIGH (ref 0.0–2.0)
Bicarbonate: 21.6 mmol/L (ref 20.0–28.0)
FIO2: 1
MECHVT: 480 mL
O2 Saturation: 97.4 %
PEEP: 15 cmH2O
Patient temperature: 37
RATE: 28 resp/min
pCO2 arterial: 62 mmHg — ABNORMAL HIGH (ref 32.0–48.0)
pH, Arterial: 7.15 — CL (ref 7.350–7.450)
pO2, Arterial: 120 mmHg — ABNORMAL HIGH (ref 83.0–108.0)

## 2022-11-13 IMAGING — US US ABDOMEN LIMITED
1 series · 14 of 25 positions shown · non-contrast
Comparison: None.

CLINICAL DATA: Hyperbilirubinemia.

EXAM:
ULTRASOUND ABDOMEN LIMITED RIGHT UPPER QUADRANT

[Series 1: us abdomen limited · 0.30mm/px · 14 of 41 slices shown]
[im 1/41]
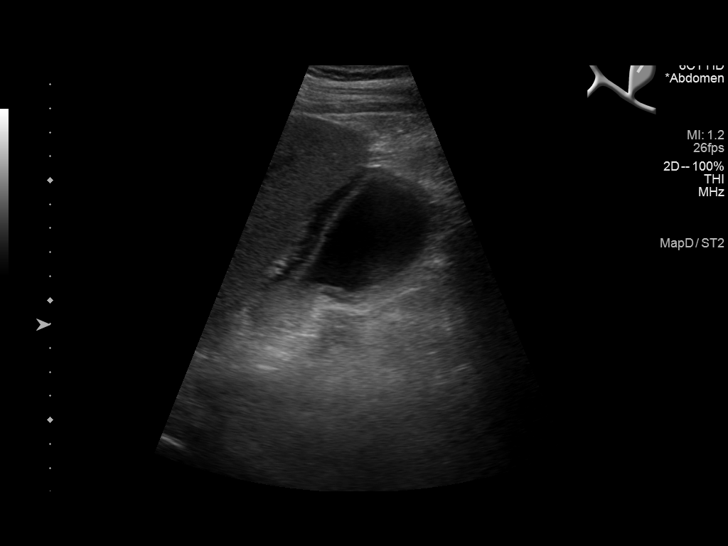
[im 4/41]
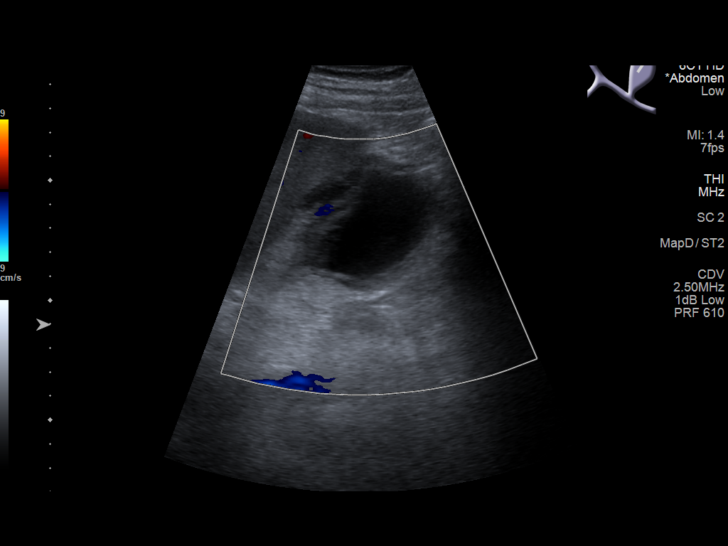
[im 7/41]
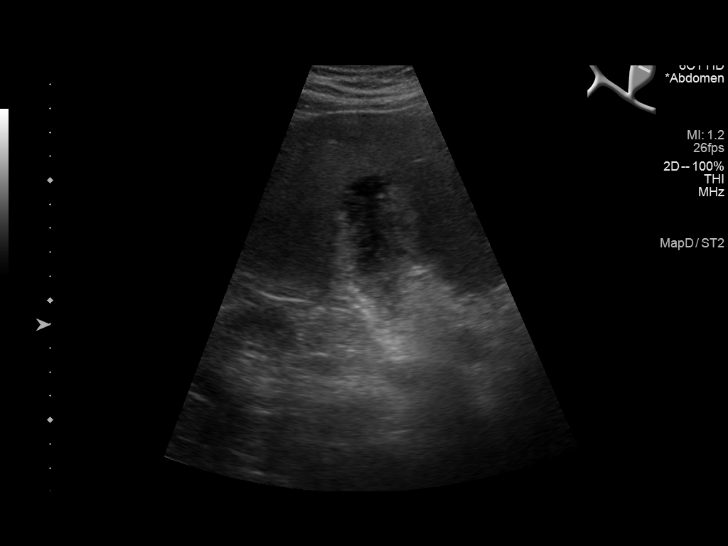
[im 11/41]
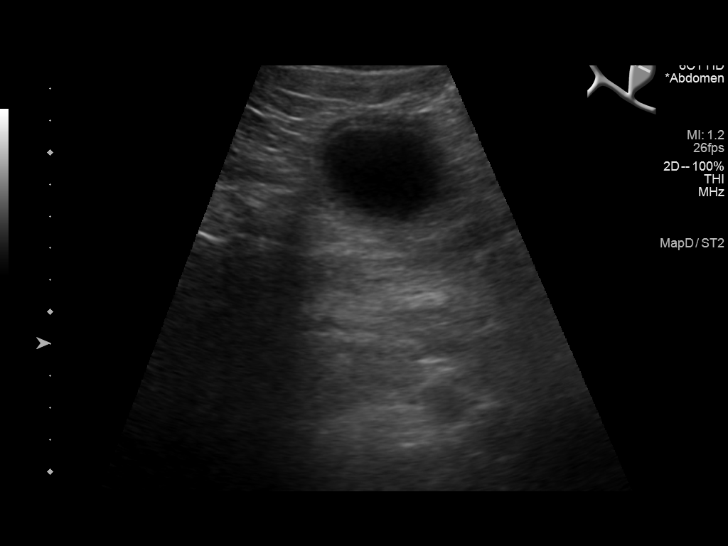
[im 14/41]
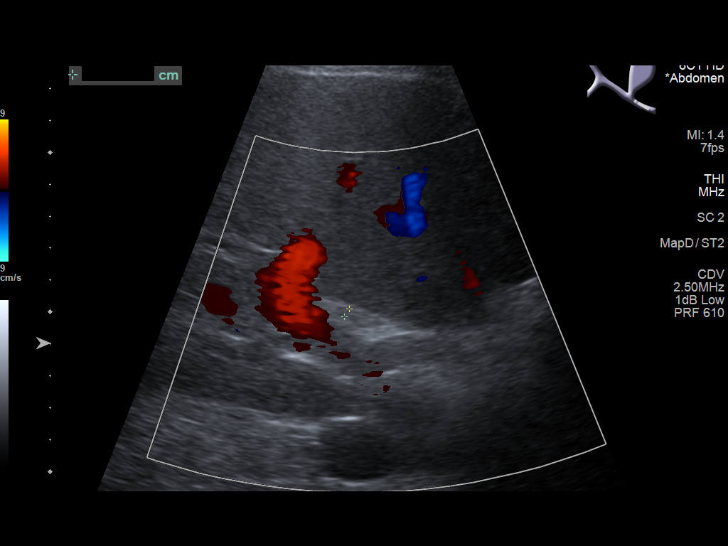
[im 16/41]
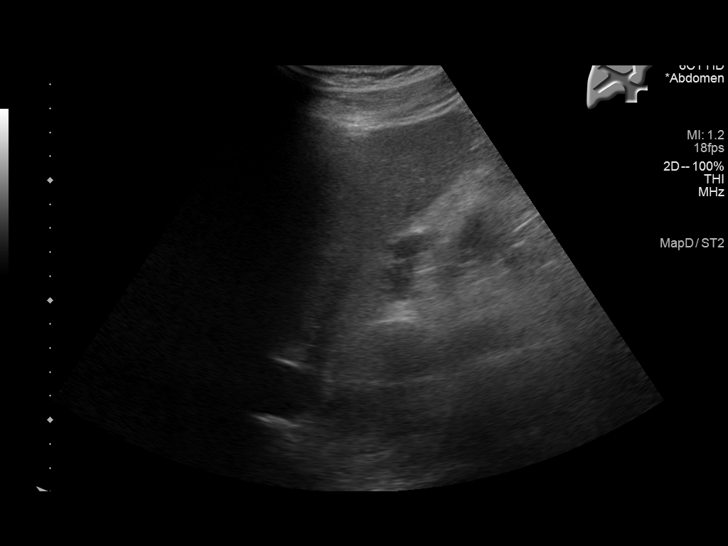
[im 19/41]
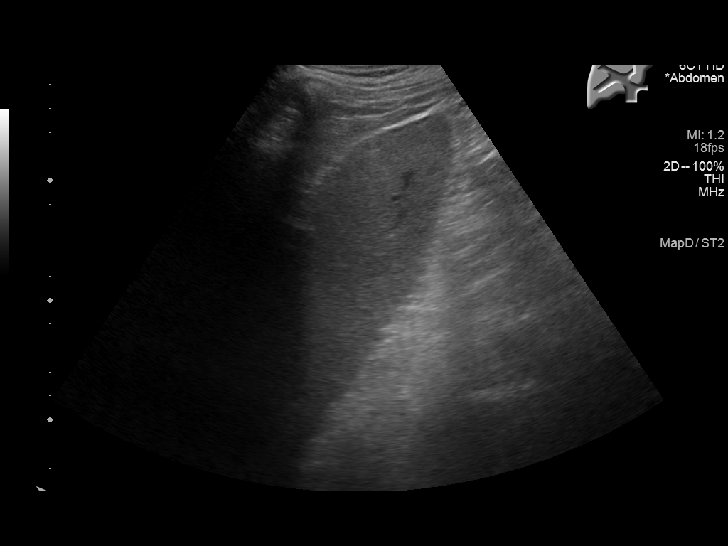
[im 22/41]
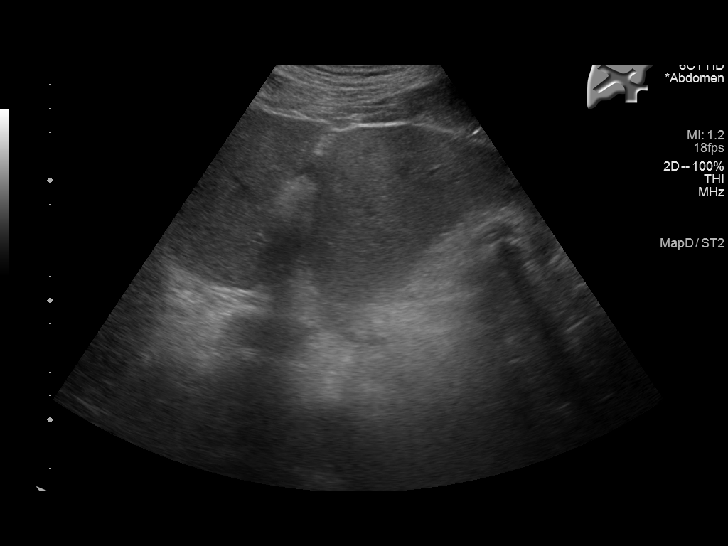
[im 26/41]
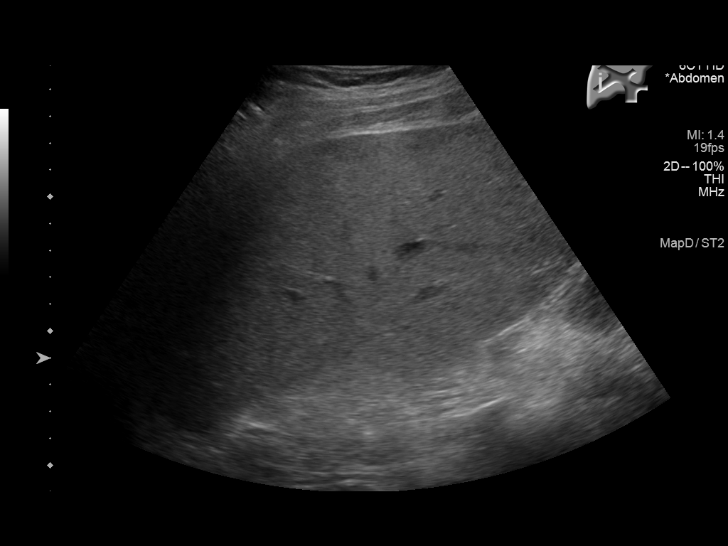
[im 27/41]
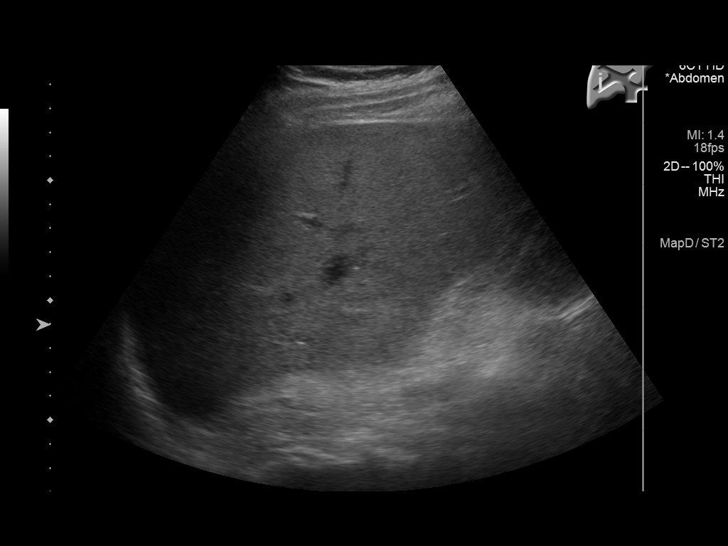
[im 31/41]
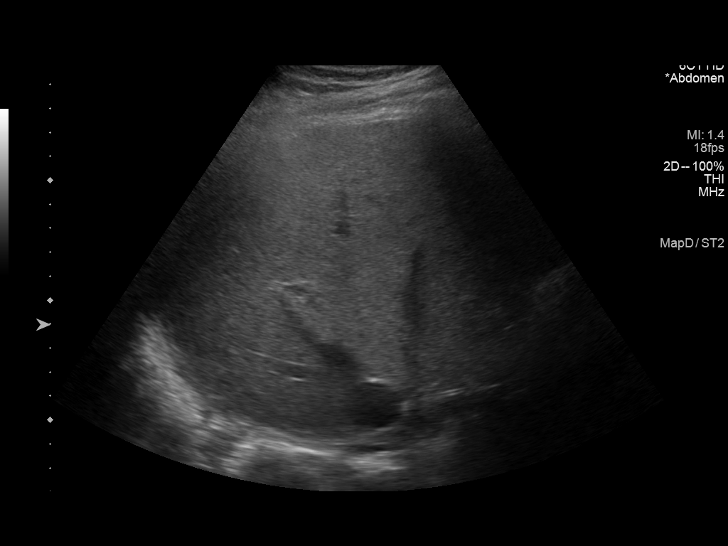
[im 34/41]
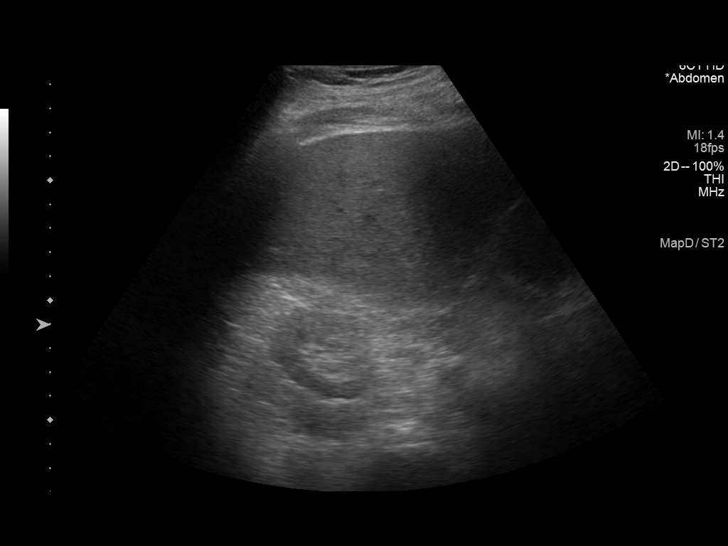
[im 37/41]
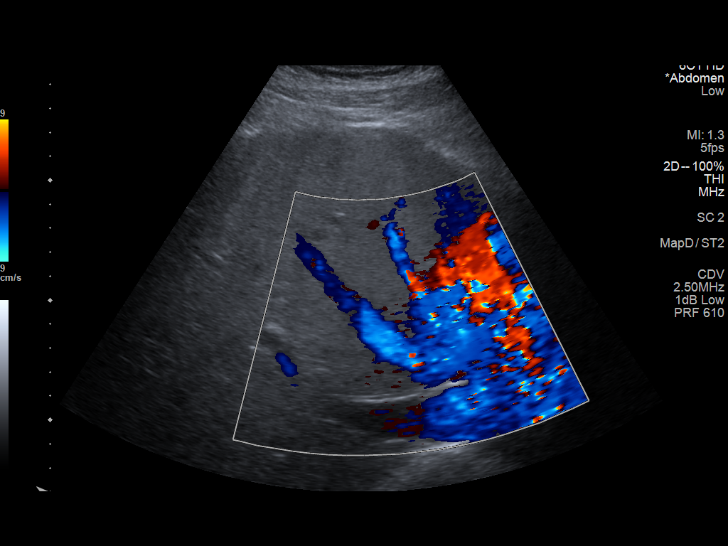
[im 41/41]
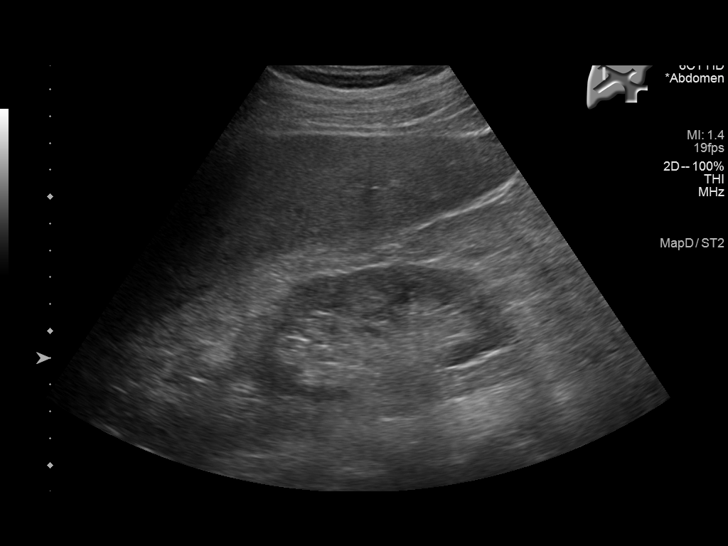

[14 of 25 positions shown; findings below may reference images not displayed]

FINDINGS: Gallbladder:

Diffuse gallbladder wall thickening with pericholecystic fluid. No
gallstones or gallbladder dilation. Sonographic Murphy sign not
evaluated by sonographer as patient is intubated.

Common bile duct:

Diameter: 2.9 mm

Liver:

No focal lesion identified. Diffusely increased echogenicity. Portal
vein is patent on color Doppler imaging with normal direction of
blood flow towards the liver.

Other: None.
IMPRESSION: 1. Peri cholecystic fluid with diffuse gallbladder wall thickening
without gallstones or gallbladder/common bile duct dilation.
Findings which are favored to be sequela of hepatic dysfunction. If
concern for cholecystitis recommend nuclear medicine HIDA scan.
2. The echogenicity of the liver is increased. This is a nonspecific
finding but is most commonly seen with fatty infiltration. There are
no obvious focal liver lesions.

## 2022-11-14 IMAGING — DX DG CHEST 1V PORT
1 series · 1 of 1 positions shown · non-contrast
Comparison: July 04, 2020.

CLINICAL DATA: Central line placement.

EXAM:
PORTABLE CHEST 1 VIEW

[chest ap]
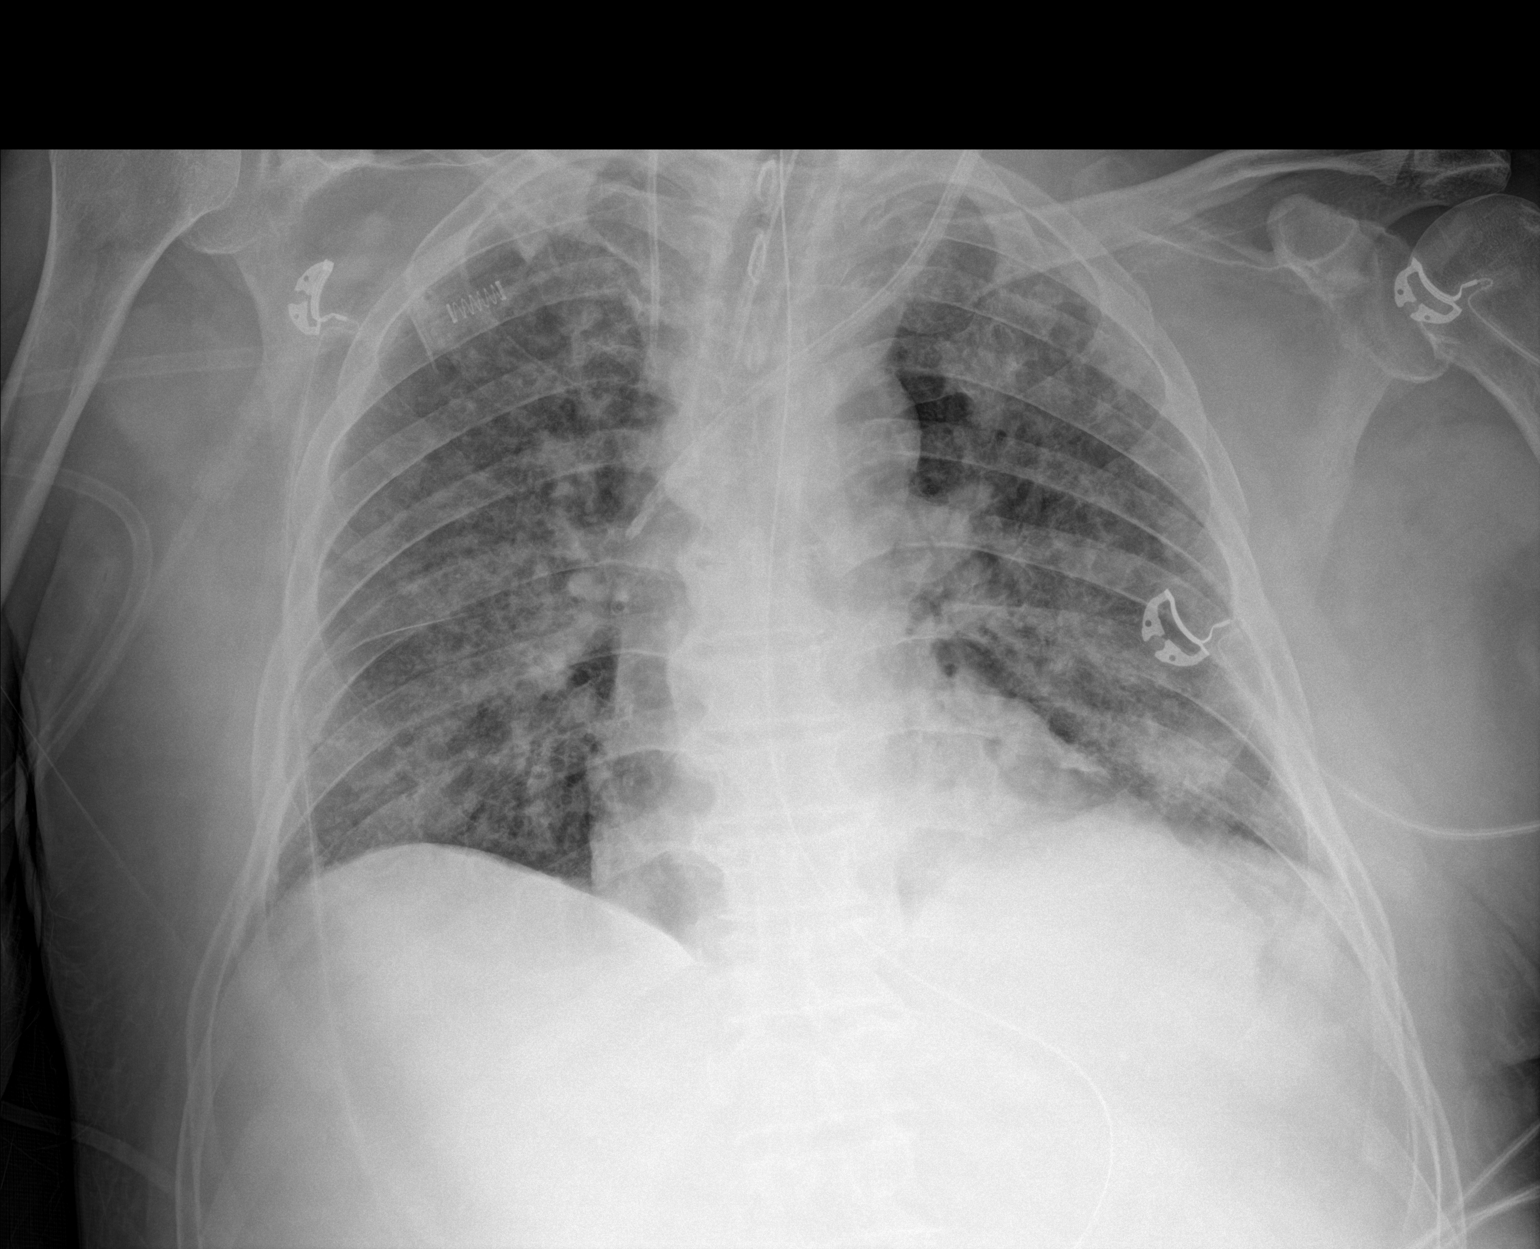

[1 of 1 positions shown; findings below may reference images not displayed]

FINDINGS: The heart size and mediastinal contours are within normal limits.
Endotracheal nasogastric tubes are unchanged in position. Right
internal jugular catheter is unchanged. Interval placement of left
internal jugular catheter with distal tip in expected position of
the SVC. No pneumothorax or pleural effusion is noted. Bilateral
patchy airspace opacities are again noted consistent with multifocal
pneumonia. The visualized skeletal structures are unremarkable.
IMPRESSION: Interval placement of left internal jugular catheter with distal tip
in expected position of the SVC. No pneumothorax is noted. Stable
bilateral patchy airspace opacities consistent with multifocal
pneumonia.
